# Patient Record
Sex: Female | Born: 1985 | Race: Black or African American | Hispanic: No | Marital: Single | Smoking: Current every day smoker
Health system: Southern US, Community
[De-identification: ages and names within clinical notes are randomized; demographics above are authoritative.]

## PROBLEM LIST (undated history)

## (undated) ENCOUNTER — Inpatient Hospital Stay (HOSPITAL_COMMUNITY): Payer: Self-pay

## (undated) DIAGNOSIS — Z8709 Personal history of other diseases of the respiratory system: Secondary | ICD-10-CM

## (undated) DIAGNOSIS — Z85850 Personal history of malignant neoplasm of thyroid: Secondary | ICD-10-CM

## (undated) DIAGNOSIS — R203 Hyperesthesia: Secondary | ICD-10-CM

## (undated) DIAGNOSIS — E039 Hypothyroidism, unspecified: Secondary | ICD-10-CM

## (undated) DIAGNOSIS — S8262XA Displaced fracture of lateral malleolus of left fibula, initial encounter for closed fracture: Secondary | ICD-10-CM

## (undated) DIAGNOSIS — D649 Anemia, unspecified: Secondary | ICD-10-CM

## (undated) HISTORY — PX: THYROID LOBECTOMY: SHX420

---
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
## 2005-02-13 ENCOUNTER — Emergency Department (HOSPITAL_COMMUNITY): Admission: EM | Admit: 2005-02-13 | Discharge: 2005-02-14 | Payer: Self-pay | Admitting: Emergency Medicine

## 2005-02-20 ENCOUNTER — Emergency Department (HOSPITAL_COMMUNITY): Admission: EM | Admit: 2005-02-20 | Discharge: 2005-02-20 | Payer: Self-pay | Admitting: Emergency Medicine

## 2005-09-10 ENCOUNTER — Other Ambulatory Visit: Admission: RE | Admit: 2005-09-10 | Discharge: 2005-09-10 | Payer: Self-pay | Admitting: Obstetrics and Gynecology

## 2005-12-06 ENCOUNTER — Emergency Department (HOSPITAL_COMMUNITY): Admission: EM | Admit: 2005-12-06 | Discharge: 2005-12-06 | Payer: Self-pay | Admitting: Emergency Medicine

## 2006-03-15 ENCOUNTER — Inpatient Hospital Stay (HOSPITAL_COMMUNITY): Admission: AD | Admit: 2006-03-15 | Discharge: 2006-03-19 | Payer: Self-pay | Admitting: Obstetrics and Gynecology

## 2006-03-16 ENCOUNTER — Encounter (INDEPENDENT_AMBULATORY_CARE_PROVIDER_SITE_OTHER): Payer: Self-pay | Admitting: Specialist

## 2006-05-02 ENCOUNTER — Ambulatory Visit (HOSPITAL_COMMUNITY): Admission: RE | Admit: 2006-05-02 | Discharge: 2006-05-02 | Payer: Self-pay | Admitting: Obstetrics and Gynecology

## 2006-05-09 ENCOUNTER — Encounter: Admission: RE | Admit: 2006-05-09 | Discharge: 2006-05-09 | Payer: Self-pay | Admitting: Obstetrics and Gynecology

## 2006-05-09 ENCOUNTER — Encounter (INDEPENDENT_AMBULATORY_CARE_PROVIDER_SITE_OTHER): Payer: Self-pay | Admitting: *Deleted

## 2006-05-09 ENCOUNTER — Other Ambulatory Visit: Admission: RE | Admit: 2006-05-09 | Discharge: 2006-05-09 | Payer: Self-pay | Admitting: Interventional Radiology

## 2006-06-03 ENCOUNTER — Ambulatory Visit (HOSPITAL_COMMUNITY): Admission: RE | Admit: 2006-06-03 | Discharge: 2006-06-04 | Payer: Self-pay | Admitting: Otolaryngology

## 2006-06-03 ENCOUNTER — Encounter (INDEPENDENT_AMBULATORY_CARE_PROVIDER_SITE_OTHER): Payer: Self-pay | Admitting: *Deleted

## 2006-06-29 ENCOUNTER — Encounter (INDEPENDENT_AMBULATORY_CARE_PROVIDER_SITE_OTHER): Payer: Self-pay | Admitting: Specialist

## 2006-06-29 ENCOUNTER — Ambulatory Visit (HOSPITAL_COMMUNITY): Admission: RE | Admit: 2006-06-29 | Discharge: 2006-06-30 | Payer: Self-pay | Admitting: Otolaryngology

## 2006-06-29 HISTORY — PX: THYROIDECTOMY: SHX17

## 2006-07-29 ENCOUNTER — Encounter (HOSPITAL_COMMUNITY): Admission: RE | Admit: 2006-07-29 | Discharge: 2006-10-27 | Payer: Self-pay | Admitting: Obstetrics and Gynecology

## 2007-06-30 ENCOUNTER — Emergency Department (HOSPITAL_COMMUNITY): Admission: EM | Admit: 2007-06-30 | Discharge: 2007-06-30 | Payer: Self-pay | Admitting: Emergency Medicine

## 2007-11-12 ENCOUNTER — Emergency Department (HOSPITAL_COMMUNITY): Admission: EM | Admit: 2007-11-12 | Discharge: 2007-11-12 | Payer: Self-pay | Admitting: Emergency Medicine

## 2009-05-24 ENCOUNTER — Emergency Department (HOSPITAL_COMMUNITY): Admission: EM | Admit: 2009-05-24 | Discharge: 2009-05-24 | Payer: Self-pay | Admitting: Family Medicine

## 2009-11-26 ENCOUNTER — Ambulatory Visit (HOSPITAL_COMMUNITY): Admission: RE | Admit: 2009-11-26 | Discharge: 2009-11-26 | Payer: Self-pay | Admitting: Obstetrics and Gynecology

## 2010-01-02 ENCOUNTER — Ambulatory Visit: Payer: Self-pay | Admitting: Oncology

## 2010-01-08 LAB — CBC WITH DIFFERENTIAL/PLATELET
BASO%: 0.3 % (ref 0.0–2.0)
Basophils Absolute: 0 10*3/uL (ref 0.0–0.1)
EOS%: 0.7 % (ref 0.0–7.0)
Eosinophils Absolute: 0.1 10*3/uL (ref 0.0–0.5)
HCT: 27.5 % — ABNORMAL LOW (ref 34.8–46.6)
HGB: 8.7 g/dL — ABNORMAL LOW (ref 11.6–15.9)
LYMPH%: 15.7 % (ref 14.0–49.7)
MCH: 24.4 pg — ABNORMAL LOW (ref 25.1–34.0)
MCHC: 31.5 g/dL (ref 31.5–36.0)
MCV: 77.4 fL — ABNORMAL LOW (ref 79.5–101.0)
MONO#: 0.5 10*3/uL (ref 0.1–0.9)
MONO%: 4.8 % (ref 0.0–14.0)
NEUT#: 7.6 10*3/uL — ABNORMAL HIGH (ref 1.5–6.5)
NEUT%: 78.5 % — ABNORMAL HIGH (ref 38.4–76.8)
Platelets: 262 10*3/uL (ref 145–400)
RBC: 3.56 10*6/uL — ABNORMAL LOW (ref 3.70–5.45)
RDW: 27.7 % — ABNORMAL HIGH (ref 11.2–14.5)
WBC: 9.7 10*3/uL (ref 3.9–10.3)
lymph#: 1.5 10*3/uL (ref 0.9–3.3)

## 2010-01-08 LAB — CHCC SMEAR

## 2010-01-08 LAB — MORPHOLOGY: PLT EST: ADEQUATE

## 2010-01-12 ENCOUNTER — Encounter (HOSPITAL_COMMUNITY): Admission: RE | Admit: 2010-01-12 | Discharge: 2010-03-25 | Payer: Self-pay | Admitting: Internal Medicine

## 2010-01-14 LAB — COMPREHENSIVE METABOLIC PANEL
ALT: 12 U/L (ref 0–35)
AST: 16 U/L (ref 0–37)
Albumin: 4 g/dL (ref 3.5–5.2)
Alkaline Phosphatase: 45 U/L (ref 39–117)
BUN: 14 mg/dL (ref 6–23)
CO2: 22 mEq/L (ref 19–32)
Calcium: 8.9 mg/dL (ref 8.4–10.5)
Chloride: 108 mEq/L (ref 96–112)
Creatinine, Ser: 0.9 mg/dL (ref 0.40–1.20)
Glucose, Bld: 85 mg/dL (ref 70–99)
Potassium: 4.3 mEq/L (ref 3.5–5.3)
Sodium: 137 mEq/L (ref 135–145)
Total Bilirubin: 0.3 mg/dL (ref 0.3–1.2)
Total Protein: 6.7 g/dL (ref 6.0–8.3)

## 2010-01-14 LAB — THYROGLOBULIN LEVEL
Thyroglobulin Ab: <30 U/mL (ref 0.0–60.0)
Thyroglobulin: <0.2 ng/mL (ref 0.0–55.0)

## 2010-01-14 LAB — IRON AND TIBC
%SAT: 61 % — ABNORMAL HIGH (ref 20–55)
Iron: 254 ug/dL — ABNORMAL HIGH (ref 42–145)
TIBC: 418 ug/dL (ref 250–470)
UIBC: 164 ug/dL

## 2010-01-14 LAB — T4, FREE: Free T4: 1.22 ng/dL (ref 0.80–1.80)

## 2010-01-14 LAB — FERRITIN: Ferritin: 6 ng/mL — ABNORMAL LOW (ref 10–291)

## 2010-01-14 LAB — TSH: TSH: 17.711 u[IU]/mL — ABNORMAL HIGH (ref 0.350–4.500)

## 2010-01-14 LAB — T4: T4, Total: 7.4 ug/dL (ref 5.0–12.5)

## 2010-01-14 LAB — LACTATE DEHYDROGENASE: LDH: 110 U/L (ref 94–250)

## 2010-03-17 ENCOUNTER — Ambulatory Visit: Payer: Self-pay | Admitting: Advanced Practice Midwife

## 2010-03-17 ENCOUNTER — Inpatient Hospital Stay (HOSPITAL_COMMUNITY)
Admission: AD | Admit: 2010-03-17 | Discharge: 2010-03-17 | Payer: Self-pay | Source: Home / Self Care | Admitting: Obstetrics and Gynecology

## 2010-04-01 ENCOUNTER — Ambulatory Visit: Payer: Self-pay | Admitting: Oncology

## 2010-08-30 ENCOUNTER — Inpatient Hospital Stay (HOSPITAL_COMMUNITY)
Admission: AD | Admit: 2010-08-30 | Discharge: 2010-08-30 | Disposition: A | Discharge status: Other Acute Inpatient Hospital | Payer: Self-pay | Source: Home / Self Care | Attending: Obstetrics and Gynecology | Admitting: Obstetrics and Gynecology

## 2010-09-07 LAB — WET PREP, GENITAL
Clue Cells Wet Prep HPF POC: NONE SEEN
Trich, Wet Prep: NONE SEEN
Yeast Wet Prep HPF POC: NONE SEEN

## 2010-09-07 LAB — GC/CHLAMYDIA PROBE AMP, GENITAL
Chlamydia, DNA Probe: NEGATIVE
GC Probe Amp, Genital: NEGATIVE

## 2010-09-07 LAB — AMNISURE RUPTURE OF MEMBRANE (ROM) NOT AT ARMC: Amnisure ROM: POSITIVE

## 2010-09-13 ENCOUNTER — Encounter: Payer: Self-pay | Admitting: Family Medicine

## 2010-11-07 LAB — URINALYSIS, ROUTINE W REFLEX MICROSCOPIC
Glucose, UA: NEGATIVE mg/dL
Leukocytes, UA: NEGATIVE
Protein, ur: NEGATIVE mg/dL
Specific Gravity, Urine: 1.025 (ref 1.005–1.030)
pH: 6 (ref 5.0–8.0)

## 2010-11-07 LAB — URINE MICROSCOPIC-ADD ON

## 2010-11-07 LAB — POCT PREGNANCY, URINE: Preg Test, Ur: POSITIVE

## 2010-11-09 LAB — HCG, SERUM, QUALITATIVE: Preg, Serum: NEGATIVE

## 2011-01-08 NOTE — Discharge Summary (Signed)
NAME:  Christina Moody, Christina Moody           ACCOUNT NO.:  [id number]   MEDICAL RECORD NO.:  [id number]          PATIENT TYPE:  INP   LOCATION:  9137                          FACILITY:  [facility]   PHYSICIAN:  Zenaida Niece, M.D.DATE OF BIRTH:  [birth year]   DATE OF ADMISSION:  03/15/2006  DATE OF DISCHARGE:  03/19/2006                                 DISCHARGE SUMMARY   ADMISSION DIAGNOSIS:  Intrauterine pregnancy at 39 weeks.   DISCHARGE DIAGNOSES:  Intrauterine pregnancy at 39 weeks, meconium-stained  amniotic fluid, nonreassuring fetal heart tracing.   PROCEDURE:  On July 25 she had a primary low transverse cesarean section.   HISTORY OF PRESENT ILLNESS:  This is a 25 year old black female gravida 1,  para 0 with an EGA of [redacted] weeks who presents with the complaint of  contractions and possibly leaking fluid. Evaluation revealed cervix to be 3,  50, -2 with an intact bag of water and regular contractions. Prenatal care  was essentially uncomplicated.   PRENATAL LABORATORIES:  Blood type is A+ with a negative antibody screen,  RPR nonreactive, rubella immune, hepatitis B surface antigen negative, HIV  negative, gonorrhea and chlamydia negative, 1 hour Glucola was 71, and group  B strep is negative.   PAST GYNECOLOGICAL HISTORY:  History of low grade SIL Pap smear with normal  colposcopy.   MEDICAL HISTORY:  Medical history of asthma for which she rarely takes  Advair and albuterol.   PHYSICAL EXAMINATION:  VITAL SIGNS:  She is afebrile with stable vital  signs.  ABDOMEN:  Gravid, nontender. Fetal heart tracing is reactive with  contractions every 2-4 minutes. Cervix is 3, 50, -2 vertex presentation and  intact bag of water.   HOSPITAL COURSE:  The patient was admitted and continued to contract on her  own. She received an epidural and then was put on Pitocin for augmentation.  On the morning of July25 cervix was 4-5, 80, and -1, and amniotomy revealed  moderate meconium-stained  amniotic fluid. An IUPC was placed to monitor  contractions, and she did receive an amnioinfusion for variable  decelerations. She progressed to complete but had significant decelerations  with pushing, and thus on the afternoon of July25  Dr. Senaida Ores took her to the operating room where she had a primary low  transverse cesarean section done under epidural anesthesia. She delivered a  viable female with Apgars of 3, 6, and 7 that weighed 7 pounds 14 ounces.  Arterial cord pH was 6.95 with a base deficit of 14.5. Anatomy was,  otherwise, normal. Postoperatively she had no significant complications.  Preoperative hemoglobin is 14.2; postoperative is 11.6. On postoperative day  #3 she is stable for discharge home. Her incision is healing well, and prior  to discharge her staples were to be removed and Steri-Strips applied.   DISCHARGE INSTRUCTIONS:  Regular diet, pelvic rest, no strenuous activity.  Follow-up is in 2 weeks. Medications are Percocet  #30, 1-2 p.o. q. 4-6 h. p.r.n. pain and over-the-counter ibuprofen as  needed, and she is given our discharge pamphlet.      Zenaida Niece, M.D.  Electronically Signed  TDM/MEDQ  D:  03/19/2006  T:  03/19/2006  Job:  [id number]

## 2011-01-08 NOTE — Op Note (Signed)
NAME:  Christina Moody, Christina Moody           ACCOUNT NO.:  [id number]   MEDICAL RECORD NO.:  [id number]          PATIENT TYPE:  OIB   LOCATION:  5729                         FACILITY:  [facility]   PHYSICIAN:  Jefry H. Pollyann Kennedy, MD     DATE OF BIRTH:  02/01/1986   DATE OF PROCEDURE:  06/29/2006  DATE OF DISCHARGE:  06/30/2006                                 OPERATIVE REPORT   PREOPERATIVE DIAGNOSIS:  Papillary thyroid carcinoma.   POSTOPERATIVE DIAGNOSIS:  Papillary thyroid carcinoma.   PROCEDURE:  Completion thyroidectomy, right side.   SURGEON:  Jefry H. Pollyann Kennedy, M.D.   ASSISTANTKarren Burly D. Jenne Pane, M.D.   ANESTHESIA:  General endotracheal anesthesia.   COMPLICATIONS:  None.   ESTIMATED BLOOD LOSS:  None.   FINDINGS:  Healthy appearing right thyroid lobe without any surrounding  adenopathy.   HISTORY:  This is a 25 year old female who recently underwent a  hemithyroidectomy which was found to contain, on permanent pathologic  evaluation, a 3.2 cm papillary carcinoma.  The decision was made to perform  completion thyroidectomy so she could undergo postoperative radioiodine scan  and treatment if necessary.  The risks, benefits, alternatives, and  complications of the procedure have been explained to the patient and her  mother, who seemed to understand and agreed to surgery.   DESCRIPTION OF PROCEDURE:  The patient was taken to the operating room and  placed on the operating table in supine position.  Following induction of  general endotracheal anesthesia, the neck was prepped and draped in a  standard fashion.  A marking pen was used to outline the proposed incision  which would include the entire scar and just above and below it.  1%  Xylocaine with epinephrine was infiltrated into the proposed incision lines.  A 15 scalpel was used to incise the skin and to excise the scar.  Electrocautery was then used to dissect down to the subplatysmal layer and  subplatysmal flap was developed  superiorly to the thyroid notch and  inferiorly to the clavicle.  A self-retaining thyroid retractor was used.  The midline fascia was divided and the isthmus was identified.  The thyroid  was then retracted towards the left side as the strap muscles were carefully  dissected off of the lateral aspect of the gland.  During the inferior pole  dissection, a parathyroid was identified and preserved with its blood  supply.  The recurrent nerve was then identified, as well, just superior to  this.  The superior pole vessels were ligated between clamps and divided and  the superior pole was brought down inferiorly.  The ligament of Allyson Sabal was  then divided carefully clamping small vessels along the dissection.  The  lobe was brought off of the trachea and removed and sent for pathologic  evaluation.  Bipolar cautery was used to complete hemostasis.  A 7-French  round JP drain was left in the wound and exited through the left side of the  incision and secured in place with a chromic suture.  The platysma layer and the midline fascia were reapproximated with chromic  suture.  A subcuticular layer  was also closed and Dermabond was used on the  skin.  The patient was then awakened, extubated, and transferred to recovery  in stable condition.      Jefry H. Pollyann Kennedy, MD  Electronically Signed     JHR/MEDQ  D:  07/01/2006  T:  07/01/2006  Job:  [id number]

## 2011-07-25 ENCOUNTER — Emergency Department (HOSPITAL_COMMUNITY): Payer: Medicaid Other

## 2011-07-25 ENCOUNTER — Emergency Department (HOSPITAL_COMMUNITY)
Admission: EM | Admit: 2011-07-25 | Discharge: 2011-07-25 | Disposition: A | Discharge status: Home / Self Care | Payer: Medicaid Other | Attending: Emergency Medicine | Admitting: Emergency Medicine

## 2011-07-25 DIAGNOSIS — R000 Tachycardia, unspecified: Secondary | ICD-10-CM | POA: Insufficient documentation

## 2011-07-25 DIAGNOSIS — R509 Fever, unspecified: Secondary | ICD-10-CM

## 2011-07-25 DIAGNOSIS — J111 Influenza due to unidentified influenza virus with other respiratory manifestations: Secondary | ICD-10-CM | POA: Insufficient documentation

## 2011-07-25 LAB — URINALYSIS, ROUTINE W REFLEX MICROSCOPIC
Bilirubin Urine: NEGATIVE
Glucose, UA: NEGATIVE mg/dL
Hgb urine dipstick: NEGATIVE
Ketones, ur: NEGATIVE mg/dL
Leukocytes, UA: NEGATIVE
Nitrite: NEGATIVE
Protein, ur: NEGATIVE mg/dL
Specific Gravity, Urine: 1.027 (ref 1.005–1.030)
Urobilinogen, UA: 1 mg/dL (ref 0.0–1.0)
pH: 6.5 (ref 5.0–8.0)

## 2011-07-25 LAB — PREGNANCY, URINE: Preg Test, Ur: NEGATIVE

## 2011-07-25 MED ORDER — KETOROLAC TROMETHAMINE 30 MG/ML IJ SOLN
INTRAMUSCULAR | Status: AC
  Administered 2011-07-25: 15 mg
  Filled 2011-07-25: qty 1

## 2011-07-25 MED ORDER — SODIUM CHLORIDE 0.9 % IV BOLUS (SEPSIS)
1000.0000 mL | Freq: Once | INTRAVENOUS | Status: AC
  Administered 2011-07-25: 1000 mL via INTRAVENOUS

## 2011-07-25 MED ORDER — KETOROLAC TROMETHAMINE 15 MG/ML IJ SOLN
15.0000 mg | Freq: Once | INTRAMUSCULAR | Status: DC
  Filled 2011-07-25: qty 1

## 2011-07-25 NOTE — ED Notes (Signed)
Patient transported to X-ray 

## 2011-07-25 NOTE — ED Notes (Signed)
Pt with cough, intermittent x 1 month with occasional post-tussive vomiting. C/o generalized body aches. Decrease in appetite and nausea.  C/o subjective fever, "feel warm", hasn't not taken temperature. Hx thyroid cancer, received radiation treatment every 2 years

## 2011-07-25 NOTE — ED Notes (Signed)
Pt in from home with head cold and body aches states ongoing for 1 month pt presents to the ED with tachycardia 140 states non productive cough and vomiting

## 2011-07-25 NOTE — ED Provider Notes (Signed)
History    25yF with multiple complaints. "I've been sick on and off for the last month." current symptoms started about 2-3 days ago. Subjective fever. Nausea. Cough with occasional post tussive emesis. HA. HA is diffuse without appreciable exacerbating or relieving factors. Gradual onset. No trauma. No neck stiffness. Children with cold like symptoms recently. No rash. No unusual leg pain or swelling. Denies use of birth control. Doesn't think pregnant. No recent surgery or immobilization. Denies hx of blood clot.   CSN: [id number] Arrival date & time: 07/25/2011  9:20 AM   First MD Initiated Contact with Patient 07/25/11 0945      Chief Complaint  Patient presents with  . Generalized Body Aches    (Consider location/radiation/quality/duration/timing/severity/associated sxs/prior treatment) HPI  Past Medical History  Diagnosis Date  . Asthma   . Cancer     History reviewed. No pertinent past surgical history.  No family history on file.  History  Substance Use Topics  . Smoking status: Never Smoker   . Smokeless tobacco: Not on file  . Alcohol Use: No    OB History    Grav Para Term Preterm Abortions TAB SAB Ect Mult Living                  Review of Systems   Review of symptoms negative unless otherwise noted in HPI.   Allergies  Review of patient's allergies indicates no known allergies.  Home Medications   Current Outpatient Rx  Name Route Sig Dispense Refill  . LEVOTHYROXINE SODIUM 175 MCG PO TABS Oral Take 175 mcg by mouth every morning.      Marland Kitchen OVER THE COUNTER MEDICATION Oral Take 30 mLs by mouth every 4 (four) hours as needed. [facility] Cough Syrup. For cough.       BP 96/45  Pulse 108  Temp(Src) 100.3 F (37.9 C) (Oral)  Resp 20  Wt 134 lb 7.7 oz (61 kg)  SpO2 97%  LMP 07/23/2011  Physical Exam  Nursing note and vitals reviewed. Constitutional: She is oriented to person, place, and time. She appears well-developed and well-nourished. No  distress.       Warm to touch  HENT:  Head: Normocephalic and atraumatic.  Right Ear: External ear normal.  Left Ear: External ear normal.  Mouth/Throat: Oropharynx is clear and moist. No oropharyngeal exudate.  Eyes: Conjunctivae are normal. Right eye exhibits no discharge. Left eye exhibits no discharge.  Neck: Normal range of motion. Neck supple.  Cardiovascular: Regular rhythm and normal heart sounds.  Exam reveals no gallop and no friction rub.   No murmur heard.      tachycardic  Pulmonary/Chest: Effort normal and breath sounds normal. No stridor. No respiratory distress.  Abdominal: Soft. She exhibits no distension. There is no tenderness.  Genitourinary:       No cva tenderness  Musculoskeletal: Normal range of motion. She exhibits no edema and no tenderness.  Lymphadenopathy:    She has no cervical adenopathy.  Neurological: She is alert and oriented to person, place, and time. She exhibits normal muscle tone.  Skin: Skin is warm. No rash noted. She is not diaphoretic.  Psychiatric: She has a normal mood and affect. Her behavior is normal. Thought content normal.    ED Course  Procedures (including critical care time)   Labs Reviewed  URINALYSIS, ROUTINE W REFLEX MICROSCOPIC  PREGNANCY, URINE   Dg Chest 2 View  07/25/2011  *RADIOLOGY REPORT*  Clinical Data: Fever, cough.  CHEST - 2  VIEW  Comparison: None.  Findings: Mild peribronchial thickening. Heart and mediastinal contours are within normal limits.  No focal opacities or effusions.  No acute bony abnormality.  IMPRESSION: No active cardiopulmonary disease.  Original Report Authenticated By: Cyndie Chime, M.D.     1. Fever   2. Influenza       MDM  25yf with fever and general malaise. Suspect viral illness, possibly influenza. Low clinical suspicion for SBI. Doubt meningitis. Pt nontoxic and no meningeal signs. CXR clear.  No respiratory distress. Pt feels better with meds and IVF. Plan continued symptomatic  tx and fu as needed. Discussed signs/symptoms to return for immediate re-evaluation.       Raeford Razor, MD 07/25/11 [phone]

## 2012-01-12 ENCOUNTER — Observation Stay (HOSPITAL_COMMUNITY)
Admission: EM | Admit: 2012-01-12 | Discharge: 2012-01-13 | Disposition: A | Discharge status: Home / Self Care | Payer: No Typology Code available for payment source | Attending: Obstetrics and Gynecology | Admitting: Obstetrics and Gynecology

## 2012-01-12 ENCOUNTER — Encounter (HOSPITAL_COMMUNITY): Payer: Self-pay | Admitting: Emergency Medicine

## 2012-01-12 DIAGNOSIS — R109 Unspecified abdominal pain: Secondary | ICD-10-CM | POA: Insufficient documentation

## 2012-01-12 DIAGNOSIS — O99891 Other specified diseases and conditions complicating pregnancy: Secondary | ICD-10-CM | POA: Insufficient documentation

## 2012-01-12 DIAGNOSIS — O479 False labor, unspecified: Secondary | ICD-10-CM

## 2012-01-12 DIAGNOSIS — Z3480 Encounter for supervision of other normal pregnancy, unspecified trimester: Secondary | ICD-10-CM

## 2012-01-12 DIAGNOSIS — O4700 False labor before 37 completed weeks of gestation, unspecified trimester: Principal | ICD-10-CM | POA: Insufficient documentation

## 2012-01-12 HISTORY — DX: Hypothyroidism, unspecified: E03.9
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
## 2012-01-12 NOTE — ED Notes (Signed)
Unable to find patient in triage
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
## 2012-01-13 ENCOUNTER — Observation Stay (HOSPITAL_COMMUNITY): Payer: No Typology Code available for payment source

## 2012-01-13 ENCOUNTER — Encounter (HOSPITAL_COMMUNITY): Payer: Self-pay

## 2012-01-13 MED ORDER — DOCUSATE SODIUM 100 MG PO CAPS: 100.0000 mg | ORAL_CAPSULE | Freq: Every day | ORAL | Status: DC

## 2012-01-13 MED ORDER — CALCIUM CARBONATE ANTACID 500 MG PO CHEW: 2.0000 | CHEWABLE_TABLET | ORAL | Status: DC | PRN

## 2012-01-13 MED ORDER — LACTATED RINGERS IV SOLN
INTRAVENOUS | Status: DC
  Administered 2012-01-13: 01:00:00 via INTRAVENOUS

## 2012-01-13 MED ORDER — ACETAMINOPHEN 325 MG PO TABS
650.0000 mg | ORAL_TABLET | ORAL | Status: DC | PRN
  Administered 2012-01-13 (×2): 650 mg via ORAL
  Filled 2012-01-13 (×2): qty 2

## 2012-01-13 MED ORDER — ZOLPIDEM TARTRATE 10 MG PO TABS
10.0000 mg | ORAL_TABLET | Freq: Every evening | ORAL | Status: DC | PRN
  Administered 2012-01-13: 10 mg via ORAL
  Filled 2012-01-13: qty 1

## 2012-01-13 MED ORDER — PRENATAL MULTIVITAMIN CH: 1.0000 | ORAL_TABLET | Freq: Every day | ORAL | Status: DC
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
## 2012-03-15 ENCOUNTER — Encounter (HOSPITAL_COMMUNITY): Payer: Self-pay | Admitting: Anesthesiology

## 2012-03-15 ENCOUNTER — Encounter (HOSPITAL_COMMUNITY): Payer: Self-pay | Admitting: Obstetrics and Gynecology

## 2012-03-15 ENCOUNTER — Encounter (HOSPITAL_COMMUNITY)
Admission: AD | Disposition: A | Discharge status: Home / Self Care | Payer: Self-pay | Source: Ambulatory Visit | Attending: Obstetrics and Gynecology

## 2012-03-15 ENCOUNTER — Inpatient Hospital Stay (HOSPITAL_COMMUNITY): Payer: Medicaid Other

## 2012-03-15 ENCOUNTER — Encounter (HOSPITAL_COMMUNITY): Payer: Self-pay | Admitting: *Deleted

## 2012-03-15 ENCOUNTER — Inpatient Hospital Stay (HOSPITAL_COMMUNITY): Payer: Medicaid Other | Admitting: Anesthesiology

## 2012-03-15 ENCOUNTER — Inpatient Hospital Stay (HOSPITAL_COMMUNITY)
Admission: AD | Admit: 2012-03-15 | Discharge: 2012-03-17 | DRG: 766 | Disposition: A | Discharge status: Home / Self Care | Payer: Medicaid Other | Source: Ambulatory Visit | Attending: Obstetrics and Gynecology | Admitting: Obstetrics and Gynecology

## 2012-03-15 DIAGNOSIS — O34219 Maternal care for unspecified type scar from previous cesarean delivery: Secondary | ICD-10-CM | POA: Diagnosis present

## 2012-03-15 DIAGNOSIS — O364XX0 Maternal care for intrauterine death, not applicable or unspecified: Principal | ICD-10-CM | POA: Diagnosis present

## 2012-03-15 LAB — CBC
HCT: 25.4 % — ABNORMAL LOW (ref 36.0–46.0)
Hemoglobin: 8.7 g/dL — ABNORMAL LOW (ref 12.0–15.0)
MCH: 31.4 pg (ref 26.0–34.0)
MCHC: 34.4 g/dL (ref 30.0–36.0)
MCHC: 34.3 g/dL (ref 30.0–36.0)
MCV: 91.4 fL (ref 78.0–100.0)
MCV: 92.4 fL (ref 78.0–100.0)
Platelets: 189 10*3/uL (ref 150–400)
RDW: 13.3 % (ref 11.5–15.5)
WBC: 13.9 10*3/uL — ABNORMAL HIGH (ref 4.0–10.5)

## 2012-03-15 LAB — COMPREHENSIVE METABOLIC PANEL
AST: 16 U/L (ref 0–37)
Alkaline Phosphatase: 69 U/L (ref 39–117)
BUN: 7 mg/dL (ref 6–23)
CO2: 21 mEq/L (ref 19–32)
Calcium: 8.7 mg/dL (ref 8.4–10.5)
Chloride: 105 mEq/L (ref 96–112)
Creatinine, Ser: 0.51 mg/dL (ref 0.50–1.10)
Creatinine, Ser: 0.66 mg/dL (ref 0.50–1.10)
GFR calc Af Amer: >90 mL/min (ref >90)
GFR calc non Af Amer: >90 mL/min (ref >90)
Glucose, Bld: 72 mg/dL (ref 70–99)
Potassium: 3.5 mEq/L (ref 3.5–5.1)
Total Bilirubin: 0.3 mg/dL (ref 0.3–1.2)
Total Protein: 6.1 g/dL (ref 6.0–8.3)

## 2012-03-15 LAB — SURGICAL PCR SCREEN
MRSA, PCR: NEGATIVE
Staphylococcus aureus: NEGATIVE

## 2012-03-15 LAB — TYPE AND SCREEN
ABO/RH(D): A POS
Unit division: 0
Unit division: 0

## 2012-03-15 LAB — RPR: RPR Ser Ql: NONREACTIVE

## 2012-03-15 SURGERY — Surgical Case
Anesthesia: Spinal | Site: Abdomen | Wound class: Clean Contaminated

## 2012-03-15 MED ORDER — DIPHENHYDRAMINE HCL 50 MG/ML IJ SOLN: 25.0000 mg | INTRAMUSCULAR | Status: DC | PRN

## 2012-03-15 MED ORDER — PRENATAL MULTIVITAMIN CH
1.0000 | ORAL_TABLET | Freq: Every day | ORAL | Status: DC
  Administered 2012-03-16: 1 via ORAL
  Filled 2012-03-15: qty 1

## 2012-03-15 MED ORDER — DIPHENHYDRAMINE HCL 50 MG/ML IJ SOLN: 12.5000 mg | INTRAMUSCULAR | Status: DC | PRN

## 2012-03-15 MED ORDER — ZOLPIDEM TARTRATE 5 MG PO TABS
5.0000 mg | ORAL_TABLET | Freq: Every evening | ORAL | Status: DC | PRN
  Administered 2012-03-16: 5 mg via ORAL
  Filled 2012-03-15: qty 1

## 2012-03-15 MED ORDER — LACTATED RINGERS IV SOLN: INTRAVENOUS | Status: DC | PRN

## 2012-03-15 MED ORDER — LEVOTHYROXINE SODIUM 150 MCG PO TABS
150.0000 ug | ORAL_TABLET | Freq: Every day | ORAL | Status: DC
  Filled 2012-03-15: qty 1

## 2012-03-15 MED ORDER — SIMETHICONE 80 MG PO CHEW: 80.0000 mg | CHEWABLE_TABLET | ORAL | Status: DC | PRN

## 2012-03-15 MED ORDER — OXYTOCIN 40 UNITS IN LACTATED RINGERS INFUSION - SIMPLE MED: 62.5000 mL/h | INTRAVENOUS | Status: AC

## 2012-03-15 MED ORDER — WITCH HAZEL-GLYCERIN EX PADS: 1.0000 "application " | MEDICATED_PAD | CUTANEOUS | Status: DC | PRN

## 2012-03-15 MED ORDER — OXYTOCIN 10 UNIT/ML IJ SOLN
INTRAMUSCULAR | Status: AC
  Filled 2012-03-15: qty 4

## 2012-03-15 MED ORDER — TETANUS-DIPHTH-ACELL PERTUSSIS 5-2.5-18.5 LF-MCG/0.5 IM SUSP: 0.5000 mL | Freq: Once | INTRAMUSCULAR | Status: DC

## 2012-03-15 MED ORDER — OXYCODONE-ACETAMINOPHEN 5-325 MG PO TABS
1.0000 | ORAL_TABLET | ORAL | Status: DC | PRN
  Administered 2012-03-16 – 2012-03-17 (×4): 1 via ORAL
  Filled 2012-03-15 (×4): qty 1

## 2012-03-15 MED ORDER — MORPHINE SULFATE 0.5 MG/ML IJ SOLN
INTRAMUSCULAR | Status: AC
  Filled 2012-03-15: qty 10

## 2012-03-15 MED ORDER — FENTANYL CITRATE 0.05 MG/ML IJ SOLN
INTRAMUSCULAR | Status: AC
  Filled 2012-03-15: qty 2

## 2012-03-15 MED ORDER — SODIUM CHLORIDE 0.9 % IJ SOLN: 3.0000 mL | INTRAMUSCULAR | Status: DC | PRN

## 2012-03-15 MED ORDER — ONDANSETRON HCL 4 MG/2ML IJ SOLN: 4.0000 mg | INTRAMUSCULAR | Status: DC | PRN

## 2012-03-15 MED ORDER — NALBUPHINE SYRINGE 5 MG/0.5 ML
INJECTION | INTRAMUSCULAR | Status: AC
Start: 2012-03-15 — End: 2012-03-15
  Administered 2012-03-15: 10 mg
  Filled 2012-03-15: qty 1

## 2012-03-15 MED ORDER — SIMETHICONE 80 MG PO CHEW
80.0000 mg | CHEWABLE_TABLET | Freq: Three times a day (TID) | ORAL | Status: DC
  Administered 2012-03-15 – 2012-03-16 (×2): 80 mg via ORAL

## 2012-03-15 MED ORDER — IBUPROFEN 600 MG PO TABS
600.0000 mg | ORAL_TABLET | Freq: Four times a day (QID) | ORAL | Status: DC
  Administered 2012-03-16 – 2012-03-17 (×4): 600 mg via ORAL
  Filled 2012-03-15 (×3): qty 1

## 2012-03-15 MED ORDER — CEFAZOLIN SODIUM-DEXTROSE 2-3 GM-% IV SOLR
2.0000 g | INTRAVENOUS | Status: AC
  Administered 2012-03-15: 2 g via INTRAVENOUS
  Filled 2012-03-15: qty 50

## 2012-03-15 MED ORDER — NALOXONE HCL 0.4 MG/ML IJ SOLN: 0.4000 mg | INTRAMUSCULAR | Status: DC | PRN

## 2012-03-15 MED ORDER — CITRIC ACID-SODIUM CITRATE 334-500 MG/5ML PO SOLN
30.0000 mL | Freq: Once | ORAL | Status: DC
  Filled 2012-03-15: qty 15

## 2012-03-15 MED ORDER — FENTANYL CITRATE 0.05 MG/ML IJ SOLN
INTRAMUSCULAR | Status: DC | PRN
  Administered 2012-03-15: 15 ug via INTRATHECAL

## 2012-03-15 MED ORDER — SCOPOLAMINE 1 MG/3DAYS TD PT72
1.0000 | MEDICATED_PATCH | Freq: Once | TRANSDERMAL | Status: DC
  Administered 2012-03-15: 1.5 mg via TRANSDERMAL

## 2012-03-15 MED ORDER — LACTATED RINGERS IV SOLN
INTRAVENOUS | Status: DC | PRN
  Administered 2012-03-15: 13:00:00 via INTRAVENOUS

## 2012-03-15 MED ORDER — DIPHENHYDRAMINE HCL 12.5 MG/5ML PO ELIX
12.5000 mg | ORAL_SOLUTION | Freq: Four times a day (QID) | ORAL | Status: DC | PRN
  Administered 2012-03-15: 12.5 mg via ORAL
  Filled 2012-03-15: qty 5

## 2012-03-15 MED ORDER — KETOROLAC TROMETHAMINE 30 MG/ML IJ SOLN: 30.0000 mg | Freq: Four times a day (QID) | INTRAMUSCULAR | Status: AC | PRN

## 2012-03-15 MED ORDER — DIPHENHYDRAMINE HCL 25 MG PO CAPS
25.0000 mg | ORAL_CAPSULE | ORAL | Status: DC | PRN
Start: 2012-03-15 — End: 2012-03-17

## 2012-03-15 MED ORDER — SODIUM CHLORIDE 0.9 % IV SOLN: 1.0000 ug/kg/h | INTRAVENOUS | Status: DC | PRN

## 2012-03-15 MED ORDER — BUPIVACAINE IN DEXTROSE 0.75-8.25 % IT SOLN
INTRATHECAL | Status: DC | PRN
  Administered 2012-03-15: 1.5 mL via INTRATHECAL

## 2012-03-15 MED ORDER — DIPHENHYDRAMINE HCL 25 MG PO CAPS: 25.0000 mg | ORAL_CAPSULE | Freq: Four times a day (QID) | ORAL | Status: DC | PRN

## 2012-03-15 MED ORDER — FENTANYL CITRATE 0.05 MG/ML IJ SOLN
INTRAMUSCULAR | Status: AC
  Administered 2012-03-15: 50 ug via INTRAVENOUS
  Filled 2012-03-15: qty 2

## 2012-03-15 MED ORDER — ONDANSETRON HCL 4 MG/2ML IJ SOLN
INTRAMUSCULAR | Status: AC
  Filled 2012-03-15: qty 2

## 2012-03-15 MED ORDER — LACTATED RINGERS IV BOLUS (SEPSIS)
500.0000 mL | Freq: Once | INTRAVENOUS | Status: AC
  Administered 2012-03-15: 500 mL via INTRAVENOUS

## 2012-03-15 MED ORDER — LEVOTHYROXINE SODIUM 175 MCG PO TABS
175.0000 ug | ORAL_TABLET | Freq: Every day | ORAL | Status: DC
  Administered 2012-03-15: 175 ug via ORAL
  Filled 2012-03-15 (×2): qty 1

## 2012-03-15 MED ORDER — SODIUM CHLORIDE 0.9 % IJ SOLN: 9.0000 mL | INTRAMUSCULAR | Status: DC | PRN

## 2012-03-15 MED ORDER — KETOROLAC TROMETHAMINE 60 MG/2ML IM SOLN
60.0000 mg | Freq: Once | INTRAMUSCULAR | Status: AC | PRN
  Administered 2012-03-15: 60 mg via INTRAMUSCULAR
  Filled 2012-03-15: qty 2

## 2012-03-15 MED ORDER — METOCLOPRAMIDE HCL 5 MG/ML IJ SOLN: 10.0000 mg | Freq: Three times a day (TID) | INTRAMUSCULAR | Status: DC | PRN

## 2012-03-15 MED ORDER — DIPHENHYDRAMINE HCL 50 MG/ML IJ SOLN: 12.5000 mg | Freq: Four times a day (QID) | INTRAMUSCULAR | Status: DC | PRN

## 2012-03-15 MED ORDER — ONDANSETRON HCL 4 MG/2ML IJ SOLN: 4.0000 mg | Freq: Four times a day (QID) | INTRAMUSCULAR | Status: DC | PRN

## 2012-03-15 MED ORDER — ZOLPIDEM TARTRATE 5 MG PO TABS: 5.0000 mg | ORAL_TABLET | Freq: Every evening | ORAL | Status: DC | PRN

## 2012-03-15 MED ORDER — ONDANSETRON HCL 4 MG PO TABS: 4.0000 mg | ORAL_TABLET | ORAL | Status: DC | PRN

## 2012-03-15 MED ORDER — DIBUCAINE 1 % RE OINT: 1.0000 "application " | TOPICAL_OINTMENT | RECTAL | Status: DC | PRN

## 2012-03-15 MED ORDER — LACTATED RINGERS IV SOLN
INTRAVENOUS | Status: DC | PRN
  Administered 2012-03-15 (×2): via INTRAVENOUS

## 2012-03-15 MED ORDER — MEPERIDINE HCL 25 MG/ML IJ SOLN: 6.2500 mg | INTRAMUSCULAR | Status: DC | PRN

## 2012-03-15 MED ORDER — ONDANSETRON HCL 4 MG/2ML IJ SOLN: 4.0000 mg | Freq: Three times a day (TID) | INTRAMUSCULAR | Status: DC | PRN

## 2012-03-15 MED ORDER — MEASLES, MUMPS & RUBELLA VAC ~~LOC~~ INJ
0.5000 mL | INJECTION | Freq: Once | SUBCUTANEOUS | Status: DC
  Filled 2012-03-15: qty 0.5

## 2012-03-15 MED ORDER — LANOLIN HYDROUS EX OINT: 1.0000 "application " | TOPICAL_OINTMENT | CUTANEOUS | Status: DC | PRN

## 2012-03-15 MED ORDER — SCOPOLAMINE 1 MG/3DAYS TD PT72
MEDICATED_PATCH | TRANSDERMAL | Status: AC
  Administered 2012-03-15: 1.5 mg via TRANSDERMAL
  Filled 2012-03-15: qty 1

## 2012-03-15 MED ORDER — SENNOSIDES-DOCUSATE SODIUM 8.6-50 MG PO TABS
2.0000 | ORAL_TABLET | Freq: Every day | ORAL | Status: DC
  Administered 2012-03-15 – 2012-03-16 (×2): 2 via ORAL

## 2012-03-15 MED ORDER — PHENYLEPHRINE HCL 10 MG/ML IJ SOLN
INTRAMUSCULAR | Status: DC | PRN
  Administered 2012-03-15 (×2): 80 ug via INTRAVENOUS
  Administered 2012-03-15: 40 ug via INTRAVENOUS

## 2012-03-15 MED ORDER — FAMOTIDINE IN NACL 20-0.9 MG/50ML-% IV SOLN
20.0000 mg | Freq: Once | INTRAVENOUS | Status: AC
  Administered 2012-03-15: 20 mg via INTRAVENOUS
  Filled 2012-03-15: qty 50

## 2012-03-15 MED ORDER — FUROSEMIDE 10 MG/ML IJ SOLN
20.0000 mg | Freq: Once | INTRAMUSCULAR | Status: AC
  Administered 2012-03-15: 20 mg via INTRAVENOUS
  Filled 2012-03-15: qty 2

## 2012-03-15 MED ORDER — LACTATED RINGERS IV BOLUS (SEPSIS)
500.0000 mL | Freq: Once | INTRAVENOUS | Status: AC
  Administered 2012-03-15: 1000 mL via INTRAVENOUS

## 2012-03-15 MED ORDER — MAGNESIUM HYDROXIDE 400 MG/5ML PO SUSP: 30.0000 mL | ORAL | Status: DC | PRN

## 2012-03-15 MED ORDER — LEVOTHYROXINE SODIUM 175 MCG PO TABS
175.0000 ug | ORAL_TABLET | Freq: Every morning | ORAL | Status: DC
  Administered 2012-03-16 – 2012-03-17 (×2): 175 ug via ORAL
  Filled 2012-03-15 (×3): qty 1

## 2012-03-15 MED ORDER — ACETAMINOPHEN 325 MG PO TABS: 650.0000 mg | ORAL_TABLET | ORAL | Status: DC | PRN

## 2012-03-15 MED ORDER — IBUPROFEN 600 MG PO TABS
600.0000 mg | ORAL_TABLET | Freq: Four times a day (QID) | ORAL | Status: DC | PRN
  Filled 2012-03-15: qty 1

## 2012-03-15 MED ORDER — KETOROLAC TROMETHAMINE 60 MG/2ML IM SOLN
INTRAMUSCULAR | Status: AC
  Administered 2012-03-15: 60 mg via INTRAMUSCULAR
  Filled 2012-03-15: qty 2

## 2012-03-15 MED ORDER — HYDROMORPHONE 0.3 MG/ML IV SOLN
INTRAVENOUS | Status: DC
Start: 2012-03-15 — End: 2012-03-16
  Administered 2012-03-15: 0.2 mg via INTRAVENOUS
  Administered 2012-03-15: 17:00:00 via INTRAVENOUS
  Administered 2012-03-16: 0.2 mg via INTRAVENOUS
  Administered 2012-03-16: 0.199 mg via INTRAVENOUS
  Filled 2012-03-15: qty 25

## 2012-03-15 MED ORDER — MORPHINE SULFATE (PF) 0.5 MG/ML IJ SOLN
INTRAMUSCULAR | Status: DC | PRN
  Administered 2012-03-15: .1 mg via INTRATHECAL

## 2012-03-15 MED ORDER — MIDAZOLAM HCL 2 MG/2ML IJ SOLN
INTRAMUSCULAR | Status: AC
  Filled 2012-03-15: qty 2

## 2012-03-15 MED ORDER — MENTHOL 3 MG MT LOZG: 1.0000 | LOZENGE | OROMUCOSAL | Status: DC | PRN

## 2012-03-15 MED ORDER — NALBUPHINE HCL 10 MG/ML IJ SOLN
5.0000 mg | INTRAMUSCULAR | Status: DC | PRN
  Administered 2012-03-15: 5 mg via INTRAVENOUS
  Filled 2012-03-15: qty 1

## 2012-03-15 MED ORDER — OXYTOCIN 10 UNIT/ML IJ SOLN
40.0000 [IU] | INTRAVENOUS | Status: DC | PRN
  Administered 2012-03-15: 40 [IU] via INTRAVENOUS

## 2012-03-15 MED ORDER — FENTANYL CITRATE 0.05 MG/ML IJ SOLN
25.0000 ug | INTRAMUSCULAR | Status: DC | PRN
  Administered 2012-03-15 (×2): 50 ug via INTRAVENOUS

## 2012-03-15 MED ORDER — NALBUPHINE HCL 10 MG/ML IJ SOLN
5.0000 mg | INTRAMUSCULAR | Status: DC | PRN
  Filled 2012-03-15: qty 0.5
  Filled 2012-03-15: qty 1

## 2012-03-15 MED ORDER — ONDANSETRON HCL 4 MG/2ML IJ SOLN
INTRAMUSCULAR | Status: DC | PRN
  Administered 2012-03-15: 4 mg via INTRAVENOUS

## 2012-03-15 SURGICAL SUPPLY — 29 items
BENZOIN TINCTURE PRP APPL 2/3 (GAUZE/BANDAGES/DRESSINGS) ×2 IMPLANT
CHLORAPREP W/TINT 26ML (MISCELLANEOUS) ×2 IMPLANT
CLOTH BEACON ORANGE TIMEOUT ST (SAFETY) ×2 IMPLANT
CONTAINER PREFILL 10% NBF 15ML (MISCELLANEOUS) IMPLANT
DRSG COVADERM 4X10 (GAUZE/BANDAGES/DRESSINGS) ×2 IMPLANT
ELECT REM PT RETURN 9FT ADLT (ELECTROSURGICAL) ×2
ELECTRODE REM PT RTRN 9FT ADLT (ELECTROSURGICAL) ×1 IMPLANT
EXTRACTOR VACUUM KIWI (MISCELLANEOUS) IMPLANT
EXTRACTOR VACUUM M CUP 4 TUBE (SUCTIONS) IMPLANT
GLOVE BIO SURGEON STRL SZ8 (GLOVE) ×2 IMPLANT
GLOVE ORTHO TXT STRL SZ7.5 (GLOVE) ×2 IMPLANT
GOWN PREVENTION PLUS LG XLONG (DISPOSABLE) ×4 IMPLANT
KIT ABG SYR 3ML LUER SLIP (SYRINGE) IMPLANT
NEEDLE HYPO 25X5/8 SAFETYGLIDE (NEEDLE) ×2 IMPLANT
NS IRRIG 1000ML POUR BTL (IV SOLUTION) ×2 IMPLANT
PACK C SECTION WH (CUSTOM PROCEDURE TRAY) ×2 IMPLANT
RTRCTR C-SECT PINK 25CM LRG (MISCELLANEOUS) ×2 IMPLANT
SLEEVE SCD COMPRESS KNEE MED (MISCELLANEOUS) IMPLANT
STAPLER VISISTAT 35W (STAPLE) IMPLANT
STRIP CLOSURE SKIN 1/2X4 (GAUZE/BANDAGES/DRESSINGS) ×2 IMPLANT
SUT CHROMIC 1 CTX 36 (SUTURE) ×6 IMPLANT
SUT PLAIN 0 NONE (SUTURE) ×2 IMPLANT
SUT PLAIN 2 0 XLH (SUTURE) IMPLANT
SUT VIC AB 0 CT1 27 (SUTURE) ×2
SUT VIC AB 0 CT1 27XBRD ANBCTR (SUTURE) ×2 IMPLANT
SUT VIC AB 4-0 KS 27 (SUTURE) IMPLANT
TOWEL OR 17X24 6PK STRL BLUE (TOWEL DISPOSABLE) ×4 IMPLANT
TRAY FOLEY CATH 14FR (SET/KITS/TRAYS/PACK) ×2 IMPLANT
WATER STERILE IRR 1000ML POUR (IV SOLUTION) ×2 IMPLANT

## 2012-03-15 NOTE — Op Note (Addendum)
Preoperative diagnosis: Intrauterine pregnancy at 31 weeks, fetal demise, previous cesarean section x 2 Postoperative diagnosis: Same Procedure: Repeat low transverse cesarean section Surgeon: Lavina Hamman M.D. Assistant:  Huel Cote, MD Anesthesia: Spinal Findings: Patient had normal gravid anatomy and delivered a nonviable female infant, weight pending Estimated blood loss: 500 cc Specimens: Placenta sent to pathology Complications: None  Procedure in detail: The patient was taken to the operating room and placed in the sitting position. Dr. Rodman Pickle instilled spinal anesthesia. Abdomen was then prepped and draped in the usual sterile fashion. A Foley catheter was placed. The level of her anesthesia was found to be adequate. Abdomen was entered via a standard Pfannenstiel incision through her previous scar. Once the peritoneal cavity was entered the Alexis disposable self-retaining retractor was placed and good visualization was achieved. A 4 cm transverse incision was then made in the lower uterine segment pushing the bladder inferior. Once the uterine cavity was entered the incision was extended digitally. 20 cc of amniotic fluid was then collected for chromosomes and PCR prior to ruptured membranes.  Membranes then ruptured revealing greenish fluid.  The fetal vertex was grasped and delivered through the incision atraumatically. Fairly loose nuchal cord x 2 reduced.  The remainder of the infant then delivered atraumatically. Cord was doubly clamped and cut and the infant handed to the OR team. The placenta delivered manually. Uterus was wiped dry with clean lap pad and all clots and debris were removed. Uterine incision was inspected and found to be free of extensions. Uterine incision was closed in one layer with running locking #1 chromic.  2 figure of 8 sutures of #1 Chromic used on the left angle for hemostasis. Tubes and ovaries were inspected and found to be normal. Uterine incision was  inspected and found to be hemostatic. Bleeding from serosal edges was controlled with electrocautery. The Alexis retractor was removed. Subfascial space was irrigated and made hemostatic with electrocautery. Fascia was closed in running fashion starting at both ends and meeting in the middle with 0 Vicryl. Subcutaneous tissue was then irrigated and made hemostatic with electrocautery. Skin was closed with running 4-0 Vicryl in a subcuticular stitch followed by steri-strips and a sterile dressing. Patient tolerated the procedure well and was taken to the recovery in stable condition. Counts were correct x2, she received Ancef 2 g IV at the beginning of the procedure and she had PAS hose on throughout the procedure.

## 2012-03-15 NOTE — Progress Notes (Signed)
Patient ID: Christina Moody, female   DOB: 06-29-1986, 26 y.o.   MRN: [id number] Pt was admitted for fetal demise. She has had 2 prior c sections and the second was for abruption at 31 weeks and required a "T' OF THE INCISION. Pt reports no fetal movement since 03-12-12 [place] here confirmed fetal demise. I have spoken to Dr. Jackelyn Knife and Senaida Ores about the pt and they will assume her care. The pt and her mother prefer that I not participate in her care because of perceived insensitivity in my statements over the phone.

## 2012-03-15 NOTE — MAU Note (Signed)
Pt to triage from lobby , pt complaint of no fetal movement x 3 days. Unable to find FHT'S with doppler, Shaw,CNM in MAU and requested to come to triage to locate [facility] with u/s. Same attempted, Shaw,CNM sees ? FHT'S but not sure, pt taken to room 5 and u/s called to come for stat BS scan. Katrinka Blazing , CNM at bedside and RN continues to locate FHT's but none heard.

## 2012-03-15 NOTE — Transfer of Care (Signed)
Immediate Anesthesia Transfer of Care Note  Patient: Christina Moody  Procedure(s) Performed: Procedure(s) (LRB): CESAREAN SECTION (N/A)  Patient Location: PACU  Anesthesia Type: Spinal  Level of Consciousness: awake, alert  and oriented  Airway & Oxygen Therapy: Patient Spontanous Breathing and Patient connected to face mask oxygen  Post-op Assessment: Report given to PACU RN and Post -op Vital signs reviewed and stable  Post vital signs: Reviewed and stable  Complications: No apparent anesthesia complications

## 2012-03-15 NOTE — Anesthesia Procedure Notes (Signed)
Spinal  Patient location during procedure: OR Start time: 03/15/2012 12:48 PM Staffing Performed by: anesthesiologist  Preanesthetic Checklist Completed: patient identified, site marked, surgical consent, pre-op evaluation, timeout performed, IV checked, risks and benefits discussed and monitors and equipment checked Spinal Block Patient position: sitting Prep: site prepped and draped and DuraPrep Patient monitoring: heart rate, cardiac monitor, continuous pulse ox and blood pressure Approach: midline Location: L3-4 Injection technique: single-shot Needle Needle type: Sprotte  Needle gauge: 24 G Needle length: 9 cm Assessment Sensory level: T4 Additional Notes Clear free flow CSF on first attempt.  No paresthesia.  Patient tolerated procedure well.  Jasmine December, MD

## 2012-03-15 NOTE — Progress Notes (Signed)
   History reviewed, patient examined, no change in status, stable for surgery.  

## 2012-03-15 NOTE — MAU Note (Signed)
Pt and mother decline Dr. Ebony Hail presence and / or intervention, stating that when pt called, Dr. Ambrose Mantle was rude and uncaring.

## 2012-03-15 NOTE — Progress Notes (Signed)
03/15/12 1500  Clinical Encounter Type  Visited With Health care provider (RN/NT)  Visit Type Other (Comment) (prep for Comfort bereavement support)  Referral From Nurse  Recommendations Spiritual Care will follow for support.  Spiritual Encounters  Spiritual Needs Grief support;Other (Comment) (Comfort materials)    Have been following Christina Moody from a distance today.  Helped RN Selena Batten in [employer] (packet, memory box, hat, diapers, gown, etc), as AICU usually cares for families with smaller babies/earlier GA.  Christina Moody is currently sleeping.    Please call [phone] for on-call chaplain to provide bereavement support tonight as needed.  [employer] or I will follow up tomorrow.  Thank you!  [address] [place], [place] [phone]
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
## 2012-03-15 NOTE — Addendum Note (Signed)
Addendum  created 03/15/12 1710 by Dana Allan, MD   Modules edited:Orders, PRL Based Order Sets

## 2012-03-15 NOTE — Anesthesia Preprocedure Evaluation (Addendum)
Anesthesia Evaluation  Patient identified by MRN, date of birth, ID band Patient awake    Reviewed: Allergy & Precautions, H&P , NPO status , Patient's Chart, lab work & pertinent test results, reviewed documented beta blocker date and time   History of Anesthesia Complications Negative for: history of anesthetic complications  Airway Mallampati: I TM Distance: >3 FB Neck ROM: full    Dental  (+) Teeth Intact   Pulmonary asthma (h/o hospitalization at 26 yo) , Current Smoker,  breath sounds clear to auscultation        Cardiovascular negative cardio ROS  Rhythm:regular Rate:Normal     Neuro/Psych negative neurological ROS  negative psych ROS   GI/Hepatic negative GI ROS, Neg liver ROS,   Endo/Other  Hypothyroidism (s/p thyroidectomy for thyroid CA)   Renal/GU negative Renal ROS     Musculoskeletal   Abdominal   Peds  Hematology  (+) Blood dyscrasia (hgb 9.9), anemia ,   Anesthesia Other Findings   Reproductive/Obstetrics (+) Pregnancy (h/o c/s x2, now with IUFD at 34 weeks)                          Anesthesia Physical Anesthesia Plan  ASA: II  Anesthesia Plan: Spinal   Post-op Pain Management:    Induction:   Airway Management Planned:   Additional Equipment:   Intra-op Plan:   Post-operative Plan:   Informed Consent: I have reviewed the patients History and Physical, chart, labs and discussed the procedure including the risks, benefits and alternatives for the proposed anesthesia with the patient or authorized representative who has indicated his/her understanding and acceptance.     Plan Discussed with: Surgeon and CRNA  Anesthesia Plan Comments:         Anesthesia Quick Evaluation

## 2012-03-15 NOTE — Progress Notes (Signed)
Patient ID: Christina Moody, female   DOB: 20-Jan-1986, 26 y.o.   MRN: [id number] Pt feeling itchy from IV pain meds.  UOP diminished but concentrated and pt did not eat after 900pm last night. She does not want to hold the baby, is fearful of dead people.  Emotional support given.  Will give 500cc bolus and see if boosts UOP.
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
## 2012-03-15 NOTE — Progress Notes (Signed)
I have spoken with the patient about her delivery options with IUFD and 2 previous c-sections, last one with "T" extension.  I discussed attempting vaginal delivery with foley bulb to dilate cervix, avoiding Cytotec.  She and her mom are considering her options and will let me know.

## 2012-03-15 NOTE — MAU Note (Signed)
Pt c/o absent fetal movement since Saturday 48 hrs ago,

## 2012-03-15 NOTE — Anesthesia Postprocedure Evaluation (Signed)
Anesthesia Post Note  Patient: Christina Moody  Procedure(s) Performed: Procedure(s) (LRB): CESAREAN SECTION (N/A)  Anesthesia type: Spinal  Patient location: PACU  Post pain: Pain level controlled  Post assessment: Post-op Vital signs reviewed  Last Vitals:  Filed Vitals:   03/15/12 1400  BP: 102/64  Pulse: 57  Temp:   Resp: 16    Post vital signs: Reviewed  Level of consciousness: awake  Complications: No apparent anesthesia complications

## 2012-03-15 NOTE — MAU Note (Signed)
Ivonne Andrew CNM in room explaining plan of care with pt. Pt opts to be admitted and discuss plan of care with oncoming physician. Pt quiet and tearful; declines chaplain visit at this time.

## 2012-03-15 NOTE — Progress Notes (Signed)
1530 - refused autopsy for infant

## 2012-03-16 ENCOUNTER — Encounter (HOSPITAL_COMMUNITY): Payer: Self-pay | Admitting: Obstetrics and Gynecology

## 2012-03-16 LAB — CBC
Platelets: 172 10*3/uL (ref 150–400)
RDW: 13.2 % (ref 11.5–15.5)
WBC: 15.7 10*3/uL — ABNORMAL HIGH (ref 4.0–10.5)

## 2012-03-16 NOTE — Progress Notes (Signed)
I received a referral from pt's nurse as well as Caro Hight.  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  Her mother has been supportive and is currently taking care of her other 2 children, (ages 56 and 1).  She is aware of resources in the community for support including the option of coming back here to speak with one of [place] in Spiritual Care.  I offered pastoral presence and grief education including some education about how her children might react.    Please page as needed, [phone]  [facility] 10:33 AM

## 2012-03-16 NOTE — Progress Notes (Signed)
UR Chart review completed.  

## 2012-03-16 NOTE — Addendum Note (Signed)
Addendum  created 03/16/12 1019 by Suella Grove, CRNA   Modules edited:Notes Section

## 2012-03-16 NOTE — Progress Notes (Signed)
Subjective: Postpartum Day #1: Cesarean Delivery Patient reports incisional pain and tolerating PO.    Objective: Vital signs in last 24 hours: Temp:  [97.4 F (36.3 C)-98.9 F (37.2 C)] 98.5 F (36.9 C) (07/25 0539) Pulse Rate:  [55-93] 80  (07/25 0539) Resp:  [13-25] 22  (07/25 0611) BP: (99-108)/(41-73) 104/60 mmHg (07/25 0539) SpO2:  [96 %-100 %] 97 % (07/25 1610)  Physical Exam:  General: alert Lochia: appropriate Uterine Fundus: firm Incision: dressing C/D/I   Basename 03/15/12 2315 03/15/12 0910  HGB 8.7* 9.9*  HCT 25.4* 28.8*    Assessment/Plan: Status post Cesarean section. Doing well postoperatively.  Continue current care, check CBC, ambulate, saline lock IV and d/c PCA.  Francisca Langenderfer D 03/16/2012, 8:56 AM

## 2012-03-16 NOTE — Progress Notes (Deleted)
UR Chart review completed.  

## 2012-03-16 NOTE — Anesthesia Postprocedure Evaluation (Signed)
  Anesthesia Post-op Note  Patient: Christina Moody  Procedure(s) Performed: Procedure(s) (LRB): CESAREAN SECTION (N/A)  Patient Location: Women's Unit  Anesthesia Type: Spinal  Level of Consciousness: awake  Airway and Oxygen Therapy: Patient Spontanous Breathing  Post-op Pain: none  Post-op Assessment: Patient's Cardiovascular Status Stable, Respiratory Function Stable, Patent Airway, No signs of Nausea or vomiting, Adequate PO intake, Pain level controlled, No headache and No backache  Post-op Vital Signs: Reviewed and stable  Complications: No apparent anesthesia complications

## 2012-03-17 ENCOUNTER — Encounter (HOSPITAL_COMMUNITY): Payer: Self-pay

## 2012-03-17 MED ORDER — IBUPROFEN 600 MG PO TABS: 600.0000 mg | ORAL_TABLET | Freq: Four times a day (QID) | ORAL | Status: AC

## 2012-03-17 MED ORDER — OXYCODONE-ACETAMINOPHEN 5-325 MG PO TABS: 1.0000 | ORAL_TABLET | ORAL | Status: AC | PRN

## 2012-03-17 NOTE — Discharge Summary (Signed)
Obstetric Discharge Summary Reason for Admission: Fetal demise at 31 weeks, previous cesarean section x 2 Prenatal Procedures: weekly 17-P Intrapartum Procedures: cesarean: low cervical, transverse Postpartum Procedures: none Complications-Operative and Postpartum: none HGB  Date Value Range Status  01/08/2010 8.7* 11.6 - 15.9 g/dL Final     Hemoglobin  Date Value Range Status  03/16/2012 8.9* 12.0 - 15.0 g/dL Final     HCT  Date Value Range Status  03/16/2012 26.3* 36.0 - 46.0 % Final  01/08/2010 27.5* 34.8 - 46.6 % Final    Physical Exam:  General: alert Lochia: appropriate Uterine Fundus: firm Incision: healing well  Discharge Diagnoses: Fetal demise at 31 weeks- unknown cause, previous cesarean section x 2, declines vaginal delivery, TORCH IgM pending  Discharge Information: Date: 03/17/2012 Activity: pelvic rest and no strenuous activity Diet: routine Medications: Ibuprofen and Percocet Condition: stable Instructions: refer to practice specific booklet Discharge to: home Follow-up Information    Follow up with Caoilainn Sacks D, MD. Schedule an appointment as soon as possible for a visit in 2 weeks.   Contact information:   [address], [address] [place] [address] [phone]          Newborn Data: Christina Moody female, declined autopsy   Christina Moody D 03/17/2012, 7:52 AM

## 2012-03-17 NOTE — Progress Notes (Signed)
Pt. Is discharged in thecare of Mother. Downstairs per ambulatory. Stable. Denies any pain or discomfort. Abdominal operative sites is clean and dry. Steri strips are clean and intact.Pt verbalize fear and anxieties about lost.Emotional support given.  Pt understood all instructions about discharge. Question asked and answered.

## 2012-03-17 NOTE — Progress Notes (Signed)
POD #2 repeat c/s for fetal demise at 31 weeks Doing ok Afeb, VSS Abd- soft, fundus firm, incision intact Hgb stable yesterday D/c home.  Lab only able to do chromosomes on amniotic fluid, will check TORCH IgM on mom prior to discharge.

## 2012-03-23 ENCOUNTER — Encounter (HOSPITAL_COMMUNITY): Payer: Self-pay | Admitting: *Deleted

## 2012-03-23 LAB — TORCH-IGM(TOXO/ RUB/ CMV/ HSV) W TITER
RPR Screen: NONREACTIVE
Rubella IgM Index: <0.9 Ratio (ref <0.90)
Toxoplasma IgM: NEGATIVE

## 2012-04-18 ENCOUNTER — Encounter (HOSPITAL_COMMUNITY): Payer: Self-pay

## 2012-04-18 ENCOUNTER — Inpatient Hospital Stay (HOSPITAL_COMMUNITY): Admit: 2012-04-18 | Payer: Medicaid Other | Admitting: Obstetrics and Gynecology

## 2012-04-18 SURGERY — Surgical Case
Anesthesia: Choice

## 2013-01-08 ENCOUNTER — Encounter (HOSPITAL_COMMUNITY): Payer: Self-pay | Admitting: Emergency Medicine

## 2013-01-08 ENCOUNTER — Emergency Department (HOSPITAL_COMMUNITY): Payer: Self-pay

## 2013-01-08 ENCOUNTER — Emergency Department (HOSPITAL_COMMUNITY)
Admission: EM | Admit: 2013-01-08 | Discharge: 2013-01-08 | Disposition: A | Discharge status: Home / Self Care | Payer: Self-pay | Attending: Emergency Medicine | Admitting: Emergency Medicine

## 2013-01-08 DIAGNOSIS — Y9389 Activity, other specified: Secondary | ICD-10-CM | POA: Insufficient documentation

## 2013-01-08 DIAGNOSIS — Y92009 Unspecified place in unspecified non-institutional (private) residence as the place of occurrence of the external cause: Secondary | ICD-10-CM | POA: Insufficient documentation

## 2013-01-08 DIAGNOSIS — Z87828 Personal history of other (healed) physical injury and trauma: Secondary | ICD-10-CM | POA: Insufficient documentation

## 2013-01-08 DIAGNOSIS — J45909 Unspecified asthma, uncomplicated: Secondary | ICD-10-CM | POA: Insufficient documentation

## 2013-01-08 DIAGNOSIS — Z79899 Other long term (current) drug therapy: Secondary | ICD-10-CM | POA: Insufficient documentation

## 2013-01-08 DIAGNOSIS — S93409A Sprain of unspecified ligament of unspecified ankle, initial encounter: Secondary | ICD-10-CM | POA: Insufficient documentation

## 2013-01-08 DIAGNOSIS — Z85850 Personal history of malignant neoplasm of thyroid: Secondary | ICD-10-CM | POA: Insufficient documentation

## 2013-01-08 DIAGNOSIS — Z87891 Personal history of nicotine dependence: Secondary | ICD-10-CM | POA: Insufficient documentation

## 2013-01-08 DIAGNOSIS — E039 Hypothyroidism, unspecified: Secondary | ICD-10-CM | POA: Insufficient documentation

## 2013-01-08 DIAGNOSIS — X500XXA Overexertion from strenuous movement or load, initial encounter: Secondary | ICD-10-CM | POA: Insufficient documentation

## 2013-01-08 MED ORDER — LEVOTHYROXINE SODIUM 100 MCG PO TABS: 100.0000 ug | ORAL_TABLET | Freq: Every day | ORAL | Status: DC

## 2013-01-08 MED ORDER — TRAMADOL HCL 50 MG PO TABS: 50.0000 mg | ORAL_TABLET | Freq: Three times a day (TID) | ORAL | Status: DC | PRN

## 2013-01-08 MED ORDER — TRAMADOL HCL 50 MG PO TABS
50.0000 mg | ORAL_TABLET | Freq: Once | ORAL | Status: AC
  Administered 2013-01-08: 50 mg via ORAL
  Filled 2013-01-08: qty 1

## 2013-01-08 NOTE — ED Provider Notes (Signed)
I saw and evaluated the patient, reviewed the resident's note and I agree with the findings and plan.   .Face to face Exam:  General:  Awake HEENT:  Atraumatic Resp:  Normal effort Abd:  Nondistended Neuro:No focal weakness Lymph: No adenopathy    Nelia Shi, MD 01/08/13 [phone]

## 2013-01-08 NOTE — Progress Notes (Signed)
Noted EDP requesting pt to have f/u care CM spoke with pt who confirms self pay [facility] resident with no pcp. Previously worked at [employer] and previously had medicaid coverage per pt.  CM discussed and provided written information for self pay pcps, importance of pcp for f/u care, www.needymeds.org, discounted pharmacies and other [place] resources such as financial assistance, DSS and  health department  Reviewed resources for [employer] self pay pcps like [facility], family medicine at [address], [facility] family practice, general medical clinics, [facility] urgent care plus others, [facility] out patient pharmacies and housing Pt voiced understanding and appreciation of resources provided

## 2013-01-08 NOTE — ED Notes (Signed)
MD at bedside. 

## 2013-01-08 NOTE — ED Notes (Signed)
Per pt, stepped off porch yesterday and twisted left ankle

## 2013-01-08 NOTE — ED Notes (Signed)
Patient transported to X-ray 
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
## 2013-03-24 ENCOUNTER — Encounter (HOSPITAL_COMMUNITY): Payer: Self-pay | Admitting: Emergency Medicine

## 2013-03-24 ENCOUNTER — Emergency Department (INDEPENDENT_AMBULATORY_CARE_PROVIDER_SITE_OTHER)
Admission: EM | Admit: 2013-03-24 | Discharge: 2013-03-24 | Disposition: A | Discharge status: Home / Self Care | Payer: Self-pay | Source: Home / Self Care | Attending: Family Medicine | Admitting: Family Medicine

## 2013-03-24 DIAGNOSIS — E039 Hypothyroidism, unspecified: Secondary | ICD-10-CM

## 2013-03-24 MED ORDER — LEVOTHYROXINE SODIUM 100 MCG PO TABS: 100.0000 ug | ORAL_TABLET | Freq: Every day | ORAL | Status: DC
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
## 2013-03-24 NOTE — ED Notes (Signed)
Wants refill on synthroid.  Patient has missed any doses.  No recent blood work done.

## 2013-06-05 ENCOUNTER — Emergency Department (INDEPENDENT_AMBULATORY_CARE_PROVIDER_SITE_OTHER)
Admission: EM | Admit: 2013-06-05 | Discharge: 2013-06-05 | Disposition: A | Discharge status: Home / Self Care | Payer: Self-pay | Source: Home / Self Care

## 2013-06-05 ENCOUNTER — Encounter (HOSPITAL_COMMUNITY): Payer: Self-pay | Admitting: Emergency Medicine

## 2013-06-05 DIAGNOSIS — E039 Hypothyroidism, unspecified: Secondary | ICD-10-CM

## 2013-06-05 MED ORDER — LEVOTHYROXINE SODIUM 100 MCG PO TABS: 100.0000 ug | ORAL_TABLET | Freq: Every day | ORAL | Status: DC

## 2013-06-05 NOTE — ED Notes (Signed)
Pt is needing refills on her thyroid meds She has noticed intermittent numbness of body??? When she's been off her meds for a while Voices no other concerns... Alert w/no signs of acute distress.

## 2013-06-05 NOTE — ED Provider Notes (Signed)
Medical screening examination/treatment/procedure(s) were performed by resident physician or non-physician practitioner and as supervising physician I was immediately available for consultation/collaboration.   Laticha Ferrucci DOUGLAS MD.   Magen Suriano D Miamarie Moll, MD 06/05/13 1414 
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
## 2013-08-11 IMAGING — CR DG CHEST 2V
2 series · 2 of 2 positions shown · non-contrast
Comparison: None.

CLINICAL DATA: Fever, cough.

CHEST - 2 VIEW

[w chest pa]
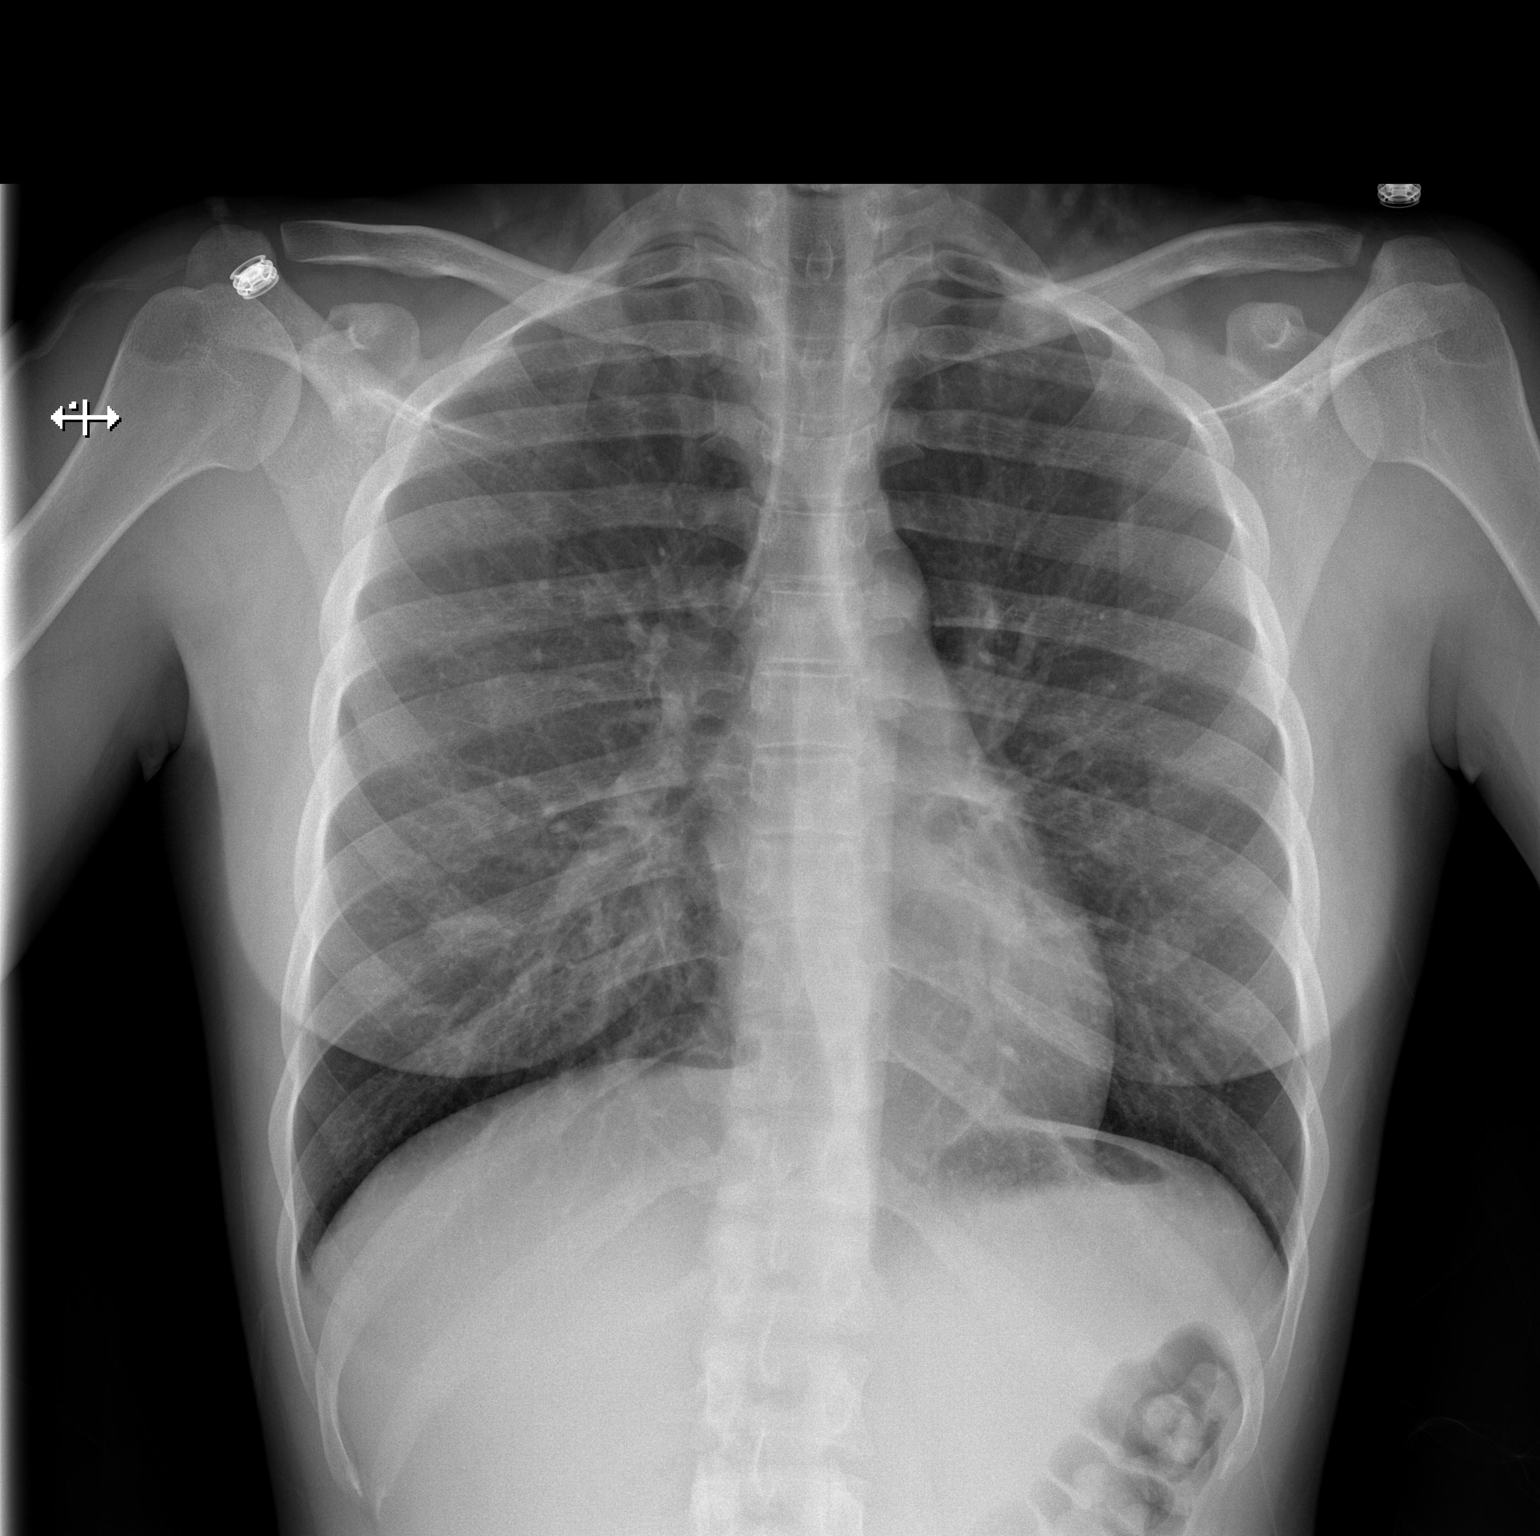

[w chest lat]
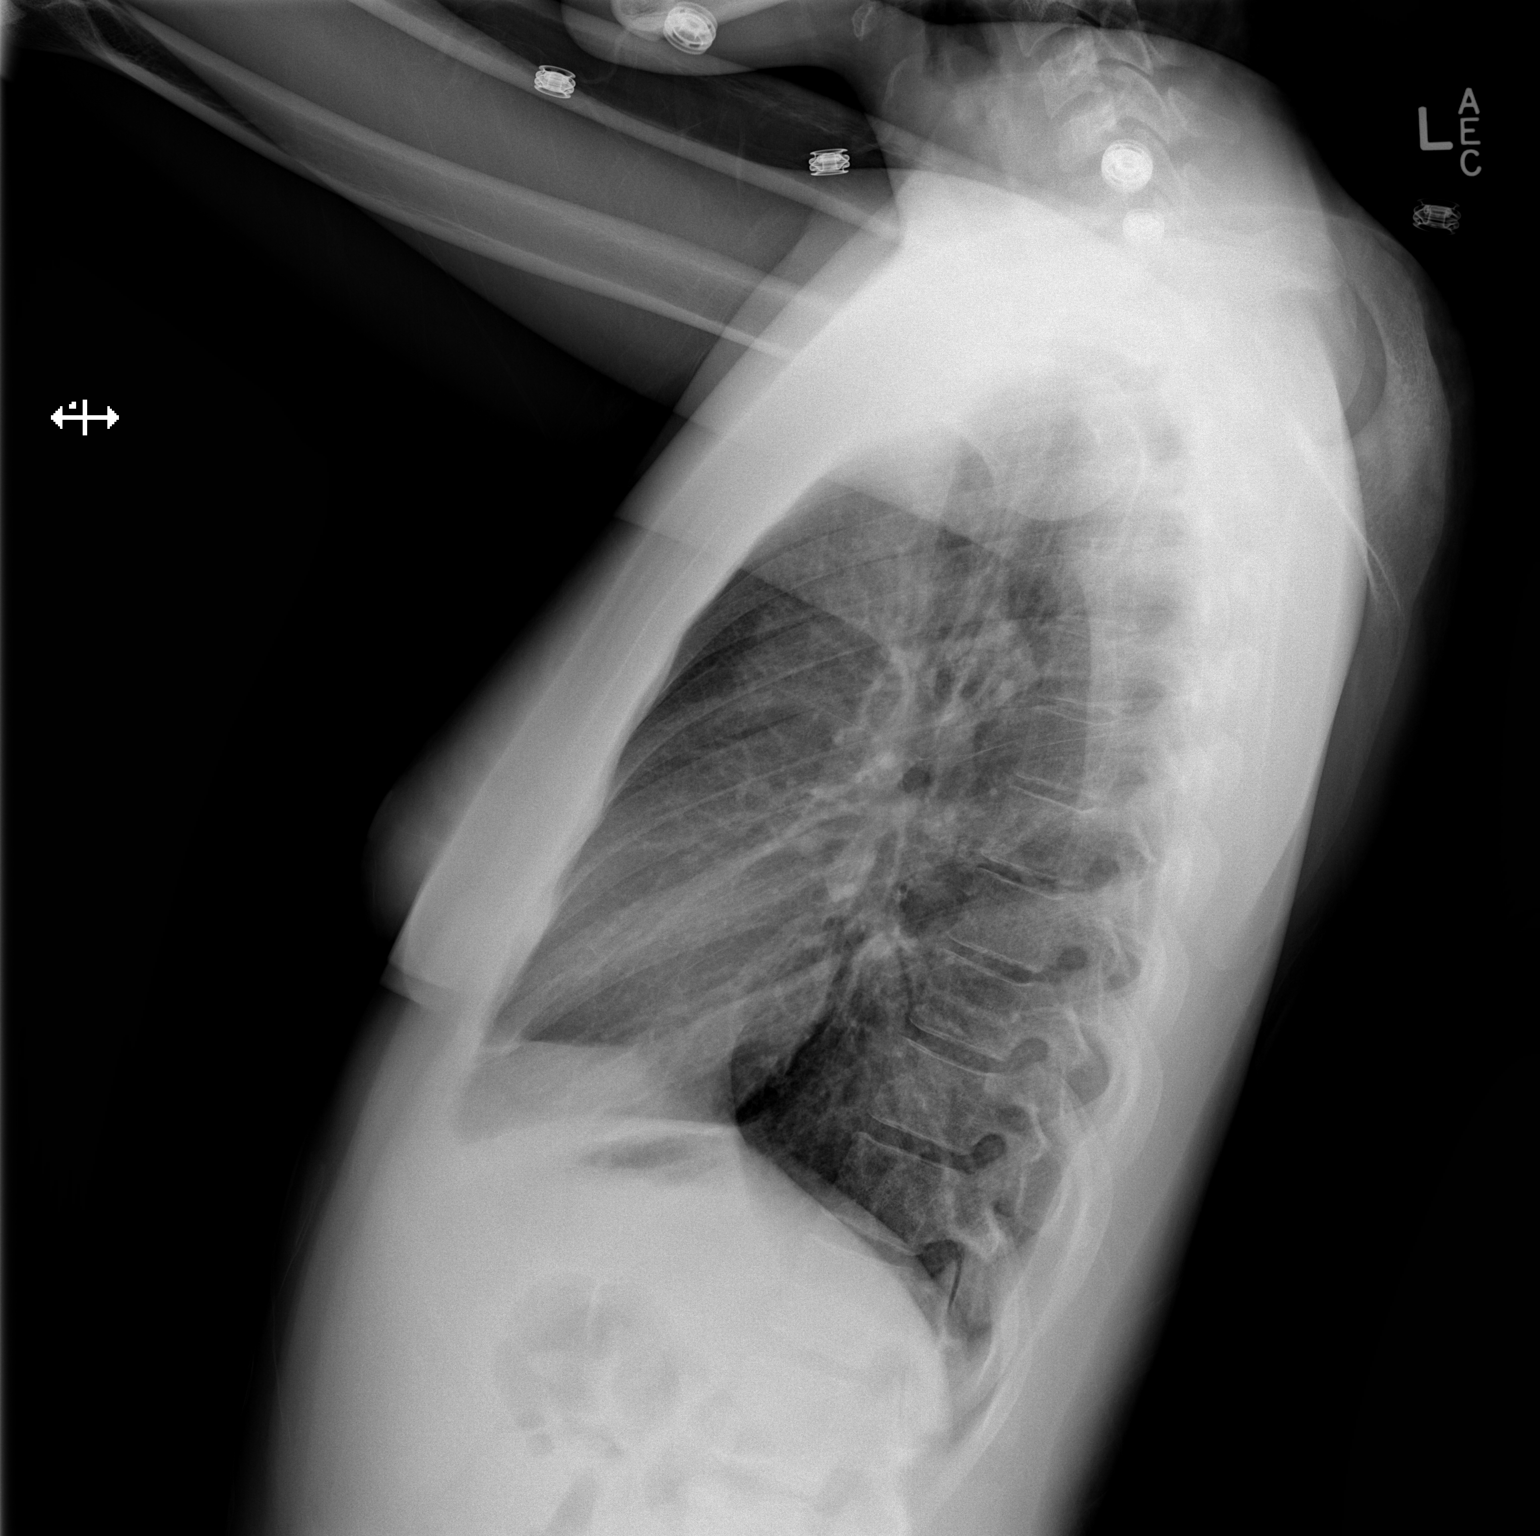

[2 of 2 positions shown; findings below may reference images not displayed]

FINDINGS: Mild peribronchial thickening. Heart and mediastinal
contours are within normal limits.  No focal opacities or
effusions.  No acute bony abnormality.
IMPRESSION: No active cardiopulmonary disease.

## 2014-06-14 ENCOUNTER — Emergency Department (HOSPITAL_COMMUNITY)
Admission: EM | Admit: 2014-06-14 | Discharge: 2014-06-14 | Disposition: A | Discharge status: Home / Self Care | Payer: Self-pay | Source: Home / Self Care | Attending: Family Medicine | Admitting: Family Medicine

## 2014-06-14 DIAGNOSIS — J069 Acute upper respiratory infection, unspecified: Secondary | ICD-10-CM

## 2014-06-14 LAB — POCT RAPID STREP A: Streptococcus, Group A Screen (Direct): NEGATIVE

## 2014-06-14 LAB — TSH: TSH: 99.77 u[IU]/mL — ABNORMAL HIGH (ref 0.350–4.500)

## 2014-06-14 MED ORDER — PROMETHAZINE HCL 25 MG PO TABS: 25.0000 mg | ORAL_TABLET | Freq: Four times a day (QID) | ORAL | Status: DC | PRN

## 2014-06-14 MED ORDER — GUAIFENESIN-CODEINE 100-10 MG/5ML PO SOLN: 5.0000 mL | ORAL | Status: DC | PRN
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
## 2014-06-14 NOTE — Discharge Instructions (Signed)
Cough, Adult ° A cough is a reflex that helps clear your throat and airways. It can help heal the body or may be a reaction to an irritated airway. A cough may only last 2 or 3 weeks (acute) or may last more than 8 weeks (chronic).  °CAUSES °Acute cough: °· Viral or bacterial infections. °Chronic cough: °· Infections. °· Allergies. °· Asthma. °· Post-nasal drip. °· Smoking. °· Heartburn or acid reflux. °· Some medicines. °· Chronic lung problems (COPD). °· Cancer. °SYMPTOMS  °· Cough. °· Fever. °· Chest pain. °· Increased breathing rate. °· High-pitched whistling sound when breathing (wheezing). °· Colored mucus that you cough up (sputum). °TREATMENT  °· A bacterial cough may be treated with antibiotic medicine. °· A viral cough must run its course and will not respond to antibiotics. °· Your caregiver may recommend other treatments if you have a chronic cough. °HOME CARE INSTRUCTIONS  °· Only take over-the-counter or prescription medicines for pain, discomfort, or fever as directed by your caregiver. Use cough suppressants only as directed by your caregiver. °· Use a cold steam vaporizer or humidifier in your bedroom or home to help loosen secretions. °· Sleep in a semi-upright position if your cough is worse at night. °· Rest as needed. °· Stop smoking if you smoke. °SEEK IMMEDIATE MEDICAL CARE IF:  °· You have pus in your sputum. °· Your cough starts to worsen. °· You cannot control your cough with suppressants and are losing sleep. °· You begin coughing up blood. °· You have difficulty breathing. °· You develop pain which is getting worse or is uncontrolled with medicine. °· You have a fever. °MAKE SURE YOU:  °· Understand these instructions. °· Will watch your condition. °· Will get help right away if you are not doing well or get worse. °Document Released: 02/05/2011 Document Revised: 11/01/2011 Document Reviewed: 02/05/2011 °ExitCare® Patient Information ©2015 [employer]. This information is not intended  to replace advice given to you by your health care provider. Make sure you discuss any questions you have with your health care provider. °Upper Respiratory Infection, Adult °An upper respiratory infection (URI) is also sometimes known as the common cold. The upper respiratory tract includes the nose, sinuses, throat, trachea, and bronchi. Bronchi are the airways leading to the lungs. Most people improve within 1 week, but symptoms can last up to 2 weeks. A residual cough may last even longer.  °CAUSES °Many different viruses can infect the tissues lining the upper respiratory tract. The tissues become irritated and inflamed and often become very moist. Mucus production is also common. A cold is contagious. You can easily spread the virus to others by oral contact. This includes kissing, sharing a glass, coughing, or sneezing. Touching your mouth or nose and then touching a surface, which is then touched by another person, can also spread the virus. °SYMPTOMS  °Symptoms typically develop 1 to 3 days after you come in contact with a cold virus. Symptoms vary from person to person. They may include: °· Runny nose. °· Sneezing. °· Nasal congestion. °· Sinus irritation. °· Sore throat. °· Loss of voice (laryngitis). °· Cough. °· Fatigue. °· Muscle aches. °· Loss of appetite. °· Headache. °· Low-grade fever. °DIAGNOSIS  °You might diagnose your own cold based on familiar symptoms, since most people get a cold 2 to 3 times a year. Your caregiver can confirm this based on your exam. Most importantly, your caregiver can check that your symptoms are not due to another disease such   as strep throat, sinusitis, pneumonia, asthma, or epiglottitis. Blood tests, throat tests, and X-rays are not necessary to diagnose a common cold, but they may sometimes be helpful in excluding other more serious diseases. Your caregiver will decide if any further tests are required. °RISKS AND COMPLICATIONS  °You may be at risk for a more severe  case of the common cold if you smoke cigarettes, have chronic heart disease (such as heart failure) or lung disease (such as asthma), or if you have a weakened immune system. The very young and very old are also at risk for more serious infections. Bacterial sinusitis, middle ear infections, and bacterial pneumonia can complicate the common cold. The common cold can worsen asthma and chronic obstructive pulmonary disease (COPD). Sometimes, these complications can require emergency medical care and may be life-threatening. °PREVENTION  °The best way to protect against getting a cold is to practice good hygiene. Avoid oral or hand contact with people with cold symptoms. Wash your hands often if contact occurs. There is no clear evidence that vitamin C, vitamin E, echinacea, or exercise reduces the chance of developing a cold. However, it is always recommended to get plenty of rest and practice good nutrition. °TREATMENT  °Treatment is directed at relieving symptoms. There is no cure. Antibiotics are not effective, because the infection is caused by a virus, not by bacteria. Treatment may include: °· Increased fluid intake. Sports drinks offer valuable electrolytes, sugars, and fluids. °· Breathing heated mist or steam (vaporizer or shower). °· Eating chicken soup or other clear broths, and maintaining good nutrition. °· Getting plenty of rest. °· Using gargles or lozenges for comfort. °· Controlling fevers with ibuprofen or acetaminophen as directed by your caregiver. °· Increasing usage of your inhaler if you have asthma. °Zinc gel and zinc lozenges, taken in the first 24 hours of the common cold, can shorten the duration and lessen the severity of symptoms. Pain medicines may help with fever, muscle aches, and throat pain. A variety of non-prescription medicines are available to treat congestion and runny nose. Your caregiver can make recommendations and may suggest nasal or lung inhalers for other symptoms.  °HOME  CARE INSTRUCTIONS  °· Only take over-the-counter or prescription medicines for pain, discomfort, or fever as directed by your caregiver. °· Use a warm mist humidifier or inhale steam from a shower to increase air moisture. This may keep secretions moist and make it easier to breathe. °· Drink enough water and fluids to keep your urine clear or pale yellow. °· Rest as needed. °· Return to work when your temperature has returned to normal or as your caregiver advises. You may need to stay home longer to avoid infecting others. You can also use a face mask and careful hand washing to prevent spread of the virus. °SEEK MEDICAL CARE IF:  °· After the first few days, you feel you are getting worse rather than better. °· You need your caregiver's advice about medicines to control symptoms. °· You develop chills, worsening shortness of breath, or brown or red sputum. These may be signs of pneumonia. °· You develop yellow or brown nasal discharge or pain in the face, especially when you bend forward. These may be signs of sinusitis. °· You develop a fever, swollen neck glands, pain with swallowing, or white areas in the back of your throat. These may be signs of strep throat. °SEEK IMMEDIATE MEDICAL CARE IF:  °· You have a fever. °· You develop severe or persistent headache, ear   pain, sinus pain, or chest pain. °· You develop wheezing, a prolonged cough, cough up blood, or have a change in your usual mucus (if you have chronic lung disease). °· You develop sore muscles or a stiff neck. °Document Released: 02/02/2001 Document Revised: 11/01/2011 Document Reviewed: 11/14/2013 °ExitCare® Patient Information ©2015 [employer]. This information is not intended to replace advice given to you by your health care provider. Make sure you discuss any questions you have with your health care provider. ° °

## 2014-06-16 LAB — CULTURE, GROUP A STREP

## 2014-06-16 NOTE — ED Provider Notes (Signed)
Medical screening examination/treatment/procedure(s) were performed by resident physician or non-physician practitioner and as supervising physician I was immediately available for consultation/collaboration.   Janziel Hockett DOUGLAS MD.   Meggan Dhaliwal D Leathie Weich, MD 06/16/14 1046 

## 2014-06-24 ENCOUNTER — Encounter (HOSPITAL_COMMUNITY): Payer: Self-pay | Admitting: Emergency Medicine

## 2015-04-22 ENCOUNTER — Other Ambulatory Visit: Payer: Self-pay | Admitting: Family Medicine

## 2015-04-22 DIAGNOSIS — E039 Hypothyroidism, unspecified: Secondary | ICD-10-CM

## 2015-04-24 ENCOUNTER — Ambulatory Visit
Admission: RE | Admit: 2015-04-24 | Discharge: 2015-04-24 | Disposition: A | Discharge status: Home / Self Care | Payer: Medicaid Other | Source: Ambulatory Visit | Attending: Family Medicine | Admitting: Family Medicine

## 2015-04-24 DIAGNOSIS — E039 Hypothyroidism, unspecified: Secondary | ICD-10-CM

## 2016-05-10 ENCOUNTER — Ambulatory Visit (HOSPITAL_COMMUNITY)
Admission: EM | Admit: 2016-05-10 | Discharge: 2016-05-10 | Disposition: A | Discharge status: Home / Self Care | Payer: Medicaid Other | Attending: Emergency Medicine | Admitting: Emergency Medicine

## 2016-05-10 ENCOUNTER — Encounter (HOSPITAL_COMMUNITY): Payer: Self-pay | Admitting: Emergency Medicine

## 2016-05-10 DIAGNOSIS — E039 Hypothyroidism, unspecified: Secondary | ICD-10-CM | POA: Diagnosis not present

## 2016-05-10 DIAGNOSIS — Z760 Encounter for issue of repeat prescription: Secondary | ICD-10-CM

## 2016-05-10 MED ORDER — LEVOTHYROXINE SODIUM 175 MCG PO TABS: 175.0000 ug | ORAL_TABLET | Freq: Every day | ORAL | 2 refills | Status: AC

## 2016-05-10 NOTE — ED Notes (Signed)
Patient has received script x 1 -hard copy script  Highlighted information needed to obtain a pcp

## 2016-05-10 NOTE — ED Notes (Signed)
Reviewed instructions and script x 1
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
## 2016-05-10 NOTE — ED Triage Notes (Signed)
Patient here for thyroid medication refill.  Patient has been out of thyroid medicine for a year

## 2016-09-23 DIAGNOSIS — S8262XA Displaced fracture of lateral malleolus of left fibula, initial encounter for closed fracture: Secondary | ICD-10-CM

## 2016-09-23 HISTORY — DX: Displaced fracture of lateral malleolus of left fibula, initial encounter for closed fracture: S82.62XA

## 2016-09-30 ENCOUNTER — Emergency Department (HOSPITAL_COMMUNITY): Payer: Medicaid Other

## 2016-09-30 ENCOUNTER — Emergency Department (HOSPITAL_COMMUNITY)
Admission: EM | Admit: 2016-09-30 | Discharge: 2016-09-30 | Disposition: A | Discharge status: Home / Self Care | Payer: Medicaid Other | Attending: Emergency Medicine | Admitting: Emergency Medicine

## 2016-09-30 ENCOUNTER — Encounter (HOSPITAL_COMMUNITY): Payer: Self-pay | Admitting: Emergency Medicine

## 2016-09-30 DIAGNOSIS — W108XXA Fall (on) (from) other stairs and steps, initial encounter: Secondary | ICD-10-CM | POA: Insufficient documentation

## 2016-09-30 DIAGNOSIS — S82432A Displaced oblique fracture of shaft of left fibula, initial encounter for closed fracture: Secondary | ICD-10-CM | POA: Diagnosis not present

## 2016-09-30 DIAGNOSIS — S99912A Unspecified injury of left ankle, initial encounter: Secondary | ICD-10-CM | POA: Diagnosis present

## 2016-09-30 DIAGNOSIS — Y999 Unspecified external cause status: Secondary | ICD-10-CM | POA: Diagnosis not present

## 2016-09-30 DIAGNOSIS — Y939 Activity, unspecified: Secondary | ICD-10-CM | POA: Diagnosis not present

## 2016-09-30 DIAGNOSIS — Y929 Unspecified place or not applicable: Secondary | ICD-10-CM | POA: Insufficient documentation

## 2016-09-30 DIAGNOSIS — J45909 Unspecified asthma, uncomplicated: Secondary | ICD-10-CM | POA: Insufficient documentation

## 2016-09-30 DIAGNOSIS — E039 Hypothyroidism, unspecified: Secondary | ICD-10-CM | POA: Diagnosis not present

## 2016-09-30 DIAGNOSIS — S82832A Other fracture of upper and lower end of left fibula, initial encounter for closed fracture: Secondary | ICD-10-CM

## 2016-09-30 MED ORDER — HYDROCODONE-ACETAMINOPHEN 5-325 MG PO TABS
1.0000 | ORAL_TABLET | Freq: Once | ORAL | Status: AC
  Administered 2016-09-30: 1 via ORAL
  Filled 2016-09-30: qty 1

## 2016-09-30 MED ORDER — NAPROXEN 500 MG PO TABS: 500.0000 mg | ORAL_TABLET | Freq: Two times a day (BID) | ORAL | 0 refills | Status: AC

## 2016-09-30 NOTE — ED Notes (Signed)
Rx x 1

## 2016-09-30 NOTE — Discharge Instructions (Signed)
Please take Ibuprofen ever 6 hours with food for pain and swelling. Continue icing the ankle for 15-20 minutes 3-5 times a day. Please schedule appointment with Dr. Roda ShuttersXu tomorrow regarding today's visit.   Get help right away if: Your cast gets damaged or breaks. You have continued severe pain. You develop new pain or swelling after the cast was put on. Your skin or toenails below the injury turn blue or gray. Your skin or toenails below the injury feel cold, numb, or have loss of sensitivity to touch. There is a bad smell or pus draining from under the cast.

## 2016-09-30 NOTE — ED Triage Notes (Signed)
Pt sts mis stepped and fell down stairs today; pt c/o left ankle pain

## 2016-09-30 NOTE — ED Notes (Signed)
Ortho tech paged and notified

## 2016-09-30 NOTE — Progress Notes (Signed)
Orthopedic Tech Progress Note Patient Details:  Christina Moody 05-Jul-1986 [id number]  Ortho Devices Type of Ortho Device: CAM walker, Crutches Ortho Device/Splint Location: lle Ortho Device/Splint Interventions: Application   Christina Moody 09/30/2016, 10:48 AM
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
## 2016-10-04 ENCOUNTER — Telehealth (INDEPENDENT_AMBULATORY_CARE_PROVIDER_SITE_OTHER): Payer: Self-pay | Admitting: Orthopaedic Surgery

## 2016-10-04 ENCOUNTER — Encounter (HOSPITAL_BASED_OUTPATIENT_CLINIC_OR_DEPARTMENT_OTHER): Payer: Self-pay | Admitting: *Deleted

## 2016-10-04 ENCOUNTER — Ambulatory Visit (INDEPENDENT_AMBULATORY_CARE_PROVIDER_SITE_OTHER): Payer: Self-pay | Admitting: Orthopaedic Surgery

## 2016-10-04 DIAGNOSIS — S8265XA Nondisplaced fracture of lateral malleolus of left fibula, initial encounter for closed fracture: Secondary | ICD-10-CM

## 2016-10-04 NOTE — Telephone Encounter (Signed)
Patient called wanting to know if there were any alternatives dealing with the surgery. CB # [phone]
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
## 2016-10-05 NOTE — Telephone Encounter (Signed)
Called patient to advise on message and she states she would like to Cx Surgery. She will come see us on Thursday. FYI

## 2016-10-05 NOTE — Telephone Encounter (Signed)
Surgery canceled

## 2016-10-05 NOTE — Telephone Encounter (Signed)
Please advise 

## 2016-10-05 NOTE — Telephone Encounter (Signed)
We can try to treat it in a cast but it may not heal correctly.  If it was my ankle I would want it fixed.

## 2016-10-05 NOTE — Telephone Encounter (Signed)
ok 

## 2016-10-06 ENCOUNTER — Ambulatory Visit (HOSPITAL_BASED_OUTPATIENT_CLINIC_OR_DEPARTMENT_OTHER): Admission: RE | Admit: 2016-10-06 | Payer: Medicaid Other | Source: Ambulatory Visit | Admitting: Orthopaedic Surgery

## 2016-10-06 HISTORY — DX: Personal history of other diseases of the respiratory system: Z87.09

## 2016-10-06 HISTORY — DX: Displaced fracture of lateral malleolus of left fibula, initial encounter for closed fracture: S82.62XA

## 2016-10-06 HISTORY — DX: Anemia, unspecified: D64.9

## 2016-10-06 HISTORY — DX: Hyperesthesia: R20.3

## 2016-10-06 HISTORY — DX: Personal history of malignant neoplasm of thyroid: Z85.850

## 2016-10-06 SURGERY — OPEN REDUCTION INTERNAL FIXATION (ORIF) ANKLE FRACTURE
Anesthesia: General | Site: Ankle | Laterality: Left

## 2016-10-07 ENCOUNTER — Ambulatory Visit (INDEPENDENT_AMBULATORY_CARE_PROVIDER_SITE_OTHER): Payer: Medicaid Other | Admitting: Orthopaedic Surgery

## 2016-10-07 DIAGNOSIS — S8265XA Nondisplaced fracture of lateral malleolus of left fibula, initial encounter for closed fracture: Secondary | ICD-10-CM

## 2016-10-07 MED ORDER — ASPIRIN EC 325 MG PO TBEC: 325.0000 mg | DELAYED_RELEASE_TABLET | Freq: Two times a day (BID) | ORAL | 0 refills | Status: AC

## 2016-10-07 NOTE — Progress Notes (Signed)
Office Visit Note   Patient: Christina KendallAutumn A Moody           Date of Birth: [birth year]           MRN: [id number] Visit Date: 10/07/2016              Requested by: Elizabeth Palaueresa Anderson, FNP [address] [place] [id number] PCP: Elizabeth PalauANDERSON,TERESA, FNP   Assessment & Plan: Visit Diagnoses:  1. Nondisplaced fracture of lateral malleolus of left fibula, initial encounter for closed fracture     Plan: discussed r/b/a associated with nonop vs. Operative treatment.  DVT and PE associated with NWB and SLC.  Aspirin 325 BID x 3 weeks prescribed.  F/u 3 weeks with repeat left ankle xrays out of cast.  She does smoke.  Follow-Up Instructions: Return in about 3 weeks (around 10/28/2016).   Orders:  No orders of the defined types were placed in this encounter.  Meds ordered this encounter  Medications  . aspirin EC 325 MG tablet    Sig: Take 1 tablet (325 mg total) by mouth 2 (two) times daily.    Dispense:  42 tablet    Refill:  0      Procedures: No procedures performed   Clinical Data: No additional findings.   Subjective: Chief Complaint  Patient presents with  . Left Ankle - Pain, Fracture    Patient comes back today requesting nonop treatment.  She doesn't want to have surgery.      Review of Systems  Constitutional: Negative.   HENT: Negative.   Eyes: Negative.   Respiratory: Negative.   Cardiovascular: Negative.   Endocrine: Negative.   Musculoskeletal: Negative.   Neurological: Negative.   Hematological: Negative.   Psychiatric/Behavioral: Negative.   All other systems reviewed and are negative.    Objective: Vital Signs: LMP 09/22/2016   Physical Exam  Constitutional: She is oriented to person, place, and time. She appears well-developed and well-nourished.  Pulmonary/Chest: Effort normal.  Neurological: She is alert and oriented to person, place, and time.  Skin: Skin is warm. Capillary refill takes less than 2 seconds.  Psychiatric:  She has a normal mood and affect. Her behavior is normal. Judgment and thought content normal.  Nursing note and vitals reviewed.   Ortho Exam Left ankle exam is stable.  Moderate swelling.  No calf ttp.  Specialty Comments:  No specialty comments available.  Imaging: No results found.   PMFS History: Patient Active Problem List   Diagnosis Date Noted  . Nondisplaced fracture of lateral malleolus of left fibula, initial encounter for closed fracture 10/04/2016  . Hypothyroid 01/08/2013  . Ankle sprain and strain 01/08/2013   Past Medical History:  Diagnosis Date  . Anemia    no current med.  . Fracture of left ankle, lateral malleolus 09/2016  . History of asthma    as a child  . History of thyroid cancer   . Hypothyroidism   . Sensitive skin     Family History  Problem Relation Age of Onset  . Cancer Other     Past Surgical History:  Procedure Laterality Date  . CESAREAN SECTION  03/15/2012   Procedure: CESAREAN SECTION;  Surgeon: Lavina Hammanodd Meisinger, MD;  Location: [facility];  Service: Gynecology;  Laterality: N/A;  . CESAREAN SECTION     x 2 (3 total)  . THYROID LOBECTOMY Left   . THYROIDECTOMY Right 06/29/2006   Social History   Occupational History  . Not on file.  Social History Main Topics  . Smoking status: Current Every Day Smoker    Packs/day: 1.00    Years: 14.00    Types: Cigarettes  . Smokeless tobacco: Never Used  . Alcohol use No  . Drug use: No  . Sexual activity: Yes

## 2016-10-07 NOTE — Addendum Note (Signed)
Addended by: Mayra ReelXU, N MICHAEL on: 10/07/2016 09:24 AM   Modules accepted: Orders

## 2016-10-14 ENCOUNTER — Inpatient Hospital Stay (INDEPENDENT_AMBULATORY_CARE_PROVIDER_SITE_OTHER): Payer: Self-pay | Admitting: Orthopaedic Surgery

## 2016-10-28 ENCOUNTER — Ambulatory Visit (INDEPENDENT_AMBULATORY_CARE_PROVIDER_SITE_OTHER): Payer: Medicaid Other | Admitting: Orthopaedic Surgery

## 2016-10-28 ENCOUNTER — Encounter (INDEPENDENT_AMBULATORY_CARE_PROVIDER_SITE_OTHER): Payer: Self-pay | Admitting: Orthopaedic Surgery

## 2016-10-28 ENCOUNTER — Ambulatory Visit (INDEPENDENT_AMBULATORY_CARE_PROVIDER_SITE_OTHER): Payer: Medicaid Other

## 2016-10-28 DIAGNOSIS — S8265XA Nondisplaced fracture of lateral malleolus of left fibula, initial encounter for closed fracture: Secondary | ICD-10-CM | POA: Diagnosis not present

## 2016-10-28 NOTE — Progress Notes (Signed)
Christina Moody is 4 weeks status post nondisplaced fibula fracture. She is doing well. She denies any pain. She denies any calf pain. She has been taking aspirin while she is been in a cast. Physical exam is benign. X-ray show stable alignment of the fracture with evidence of healing. At this point we'll advance her to weight-bear as tolerated in a Cam Walker. We'll see her back in 6 weeks with repeat 3 view x-rays of the left ankle. Anticipate releasing her at that time.

## 2016-11-16 ENCOUNTER — Ambulatory Visit: Payer: Medicaid Other | Attending: Orthopaedic Surgery | Admitting: Physical Therapy

## 2016-11-16 ENCOUNTER — Encounter: Payer: Self-pay | Admitting: Physical Therapy

## 2016-11-16 DIAGNOSIS — M25672 Stiffness of left ankle, not elsewhere classified: Secondary | ICD-10-CM | POA: Diagnosis present

## 2016-11-16 DIAGNOSIS — X58XXXD Exposure to other specified factors, subsequent encounter: Secondary | ICD-10-CM | POA: Diagnosis not present

## 2016-11-16 DIAGNOSIS — S82892D Other fracture of left lower leg, subsequent encounter for closed fracture with routine healing: Secondary | ICD-10-CM | POA: Diagnosis not present

## 2016-11-16 DIAGNOSIS — M25572 Pain in left ankle and joints of left foot: Secondary | ICD-10-CM | POA: Diagnosis present

## 2016-11-17 ENCOUNTER — Encounter: Payer: Self-pay | Admitting: Physical Therapy

## 2016-11-17 NOTE — Therapy (Addendum)
[place], [place], [address] Phone: [phone]   Fax:  [phone]  Physical Therapy Evaluation/ Discharge   Patient Details  Name: Christina Moody MRN: [id number] Date of Birth: Jan 06, 1986 Referring Provider: Dr Frankey Shown   Encounter Date: 11/16/2016    Past Medical History:  Diagnosis Date  . Anemia    no current med.  . Fracture of left ankle, lateral malleolus 09/2016  . History of asthma    as a child  . History of thyroid cancer   . Hypothyroidism   . Sensitive skin     Past Surgical History:  Procedure Laterality Date  . CESAREAN SECTION  03/15/2012   Procedure: CESAREAN SECTION;  Surgeon: Cheri Fowler, MD;  Location: [facility];  Service: Gynecology;  Laterality: N/A;  . CESAREAN SECTION     x 2 (3 total)  . THYROID LOBECTOMY Left   . THYROIDECTOMY Right 06/29/2006    There were no vitals filed for this visit.       Subjective Assessment - 11/17/16 2029    Subjective Patient fractured her left ankle in February of 2018. She had a non-displaced fracture. X-rays show a non-displaced fracture. Per patient her last x-ray showed good healing. She is in a cam walker boot for 4 more weeks. She is having very little pain.,    Limitations Standing;Walking   How long can you sit comfortably? No limit q            [facility] PT Assessment - 11/17/16 0001      Assessment   Medical Diagnosis Left ankle fracture    Referring Provider Dr Frankey Shown    Onset Date/Surgical Date 09/24/16   Hand Dominance Right   Next MD Visit 4 weeks    Prior Therapy None      Precautions   Precautions None   Precaution Comments Patient is iin a cm walker boot      Restrictions   Weight Bearing Restrictions Yes   RLE Weight Bearing Weight bearing as tolerated   Other Position/Activity Restrictions to wear boot when walking      Balance Screen   Has the patient fallen in the past 6 months No   Has  the patient had a decrease in activity level because of a fear of falling?  No   Is the patient reluctant to leave their home because of a fear of falling?  No     Home Environment   Additional Comments 5 steps into her house      Prior Function   Level of Independence Independent   Leisure walking      Cognition   Overall Cognitive Status Within Functional Limits for tasks assessed   Attention Focused   Focused Attention Appears intact   Memory Appears intact   Awareness Appears intact   Problem Solving Appears intact     Observation/Other Assessments   Focus on Therapeutic Outcomes (FOTO)  53% limitation      Observation/Other Assessments-Edema    Edema --  mild swelling noted around the lateral maleolus      Sensation   Additional Comments No swelling noted     AROM   Right Ankle Dorsiflexion 20   Right Ankle Plantar Flexion 48   Right Ankle Inversion 20   Right Ankle Eversion 18   Left Ankle Dorsiflexion 12   Left Ankle Plantar Flexion 46   Left Ankle Inversion 16   Left Ankle Eversion 8  PROM   Overall PROM Comments right ankle WFL    Left Ankle Dorsiflexion 5  with pain    Left Ankle Plantar Flexion 38    Left Ankle Inversion 18 with pain    Left Ankle Eversion 10 with pain      Strength   Right Ankle Dorsiflexion 5/5   Right Ankle Plantar Flexion 5/5   Right Ankle Inversion 5/5   Right Ankle Eversion 5/5   Left Ankle Dorsiflexion 4/5   Left Ankle Plantar Flexion 2/5   Left Ankle Inversion 3+/5   Left Ankle Eversion 3/5     Palpation   Palpation comment mild tenderness to palpation in the lateral ankle      Ambulation/Gait   Gait Comments without boot; decreased heel strike and decreased weight bearing on the left ankle                    PHYSICAL THERAPY DISCHARGE SUMMARY  Visits from Start of Care: 1  Current functional level related to goals / functional outcomes: 1 visit per mediciad    Remaining deficits: 1 per mediciad     1 per medicaid  Plan: Patient agrees to discharge.  Patient goals were not met. Patient is being discharged due to meeting the stated rehab goals.  ?????             PT Education - 11/17/16 2036    Education provided Yes   Education Details significant education provided on symptom mangement and exercise progression   Person(s) Educated Patient   Methods Explanation;Demonstration;Tactile cues;Verbal cues   Comprehension Verbalized understanding;Returned demonstration;Verbal cues required;Tactile cues required          PT Short Term Goals - 11/17/16 2046      PT SHORT TERM GOAL #1   Title Patient will verbalize improtance of symptom management    Time 4   Period Weeks   Status Achieved     PT SHORT TERM GOAL #2   Title Patient will be independent with initial HEP    Time 4   Period Weeks   Status New                  Plan - 11/17/16 2038    Clinical Impression Statement Patient presents with limited dorsiflexion, eversion, inversion, and strength. The patient was given an extensive HEP with activity progression that brings her up to weight bearing outside the boot. Per medicaid guidlines the patient will only get 1 visit.  [employer]pay reviewed symptom management and the importance of regaining strength, stability, and range of motion.    Rehab Potential Good   Clinical Impairments Affecting Rehab Potential limited visits    PT Frequency One time visit   PT Treatment/Interventions ADLs/Self Care Home Management;Gait training;Stair training;Therapeutic activities;Therapeutic exercise;Patient/family education;Neuromuscular re-education   PT Next Visit Plan 1x visit    PT Home Exercise Plan see instructions    Consulted and Agree with Plan of Care Patient      Patient will benefit from skilled therapeutic intervention in order to improve the following deficits and impairments:  Abnormal gait, Pain, Decreased strength, Decreased endurance, Decreased activity  tolerance  Visit Diagnosis: Closed left ankle fracture, with routine healing, subsequent encounter - Plan: PT plan of care cert/re-cert  Stiffness of left ankle, not elsewhere classified - Plan: PT plan of care cert/re-cert  Pain in left ankle and joints of left foot - Plan: PT plan of care cert/re-cert     Problem List  Patient Active Problem List   Diagnosis Date Noted  . Nondisplaced fracture of lateral malleolus of left fibula, initial encounter for closed fracture 10/04/2016  . Hypothyroid 01/08/2013  . Ankle sprain and strain 01/08/2013    Carney Living PT DPT  11/17/2016, 8:54 PM  [facility] [address] [place], [place], [address] Phone: [phone]   Fax:  [phone]  Name: KYESHA BALLA MRN: [id number] Date of Birth: 10-May-1986

## 2016-12-09 ENCOUNTER — Encounter (INDEPENDENT_AMBULATORY_CARE_PROVIDER_SITE_OTHER): Payer: Self-pay | Admitting: Orthopaedic Surgery

## 2016-12-09 ENCOUNTER — Ambulatory Visit (INDEPENDENT_AMBULATORY_CARE_PROVIDER_SITE_OTHER): Payer: Medicaid Other | Admitting: Orthopaedic Surgery

## 2016-12-09 ENCOUNTER — Ambulatory Visit (INDEPENDENT_AMBULATORY_CARE_PROVIDER_SITE_OTHER): Payer: Medicaid Other

## 2016-12-09 DIAGNOSIS — S8265XA Nondisplaced fracture of lateral malleolus of left fibula, initial encounter for closed fracture: Secondary | ICD-10-CM | POA: Diagnosis not present

## 2016-12-09 NOTE — Progress Notes (Signed)
Christina Moody is 8-9 weeks status post nondisplaced distal fibula fracture. She is not complaining of any symptoms. No pain no swelling she's been wearing regular shoes for about 2 weeks now. X-ray show healed fracture without any competition. On exam she has no swelling or tenderness palpation. At this point she can increase activity as tolerated. I will see her back as needed questions encouraged and answered.

## 2018-10-18 IMAGING — CR DG ANKLE COMPLETE 3+V*L*
3 series · 3 of 3 positions shown · non-contrast
Comparison: 01/08/2013

CLINICAL DATA: Twisted ankle with lateral pain, initial encounter

EXAM:
LEFT ANKLE COMPLETE - 3+ VIEW

[ankle ap]
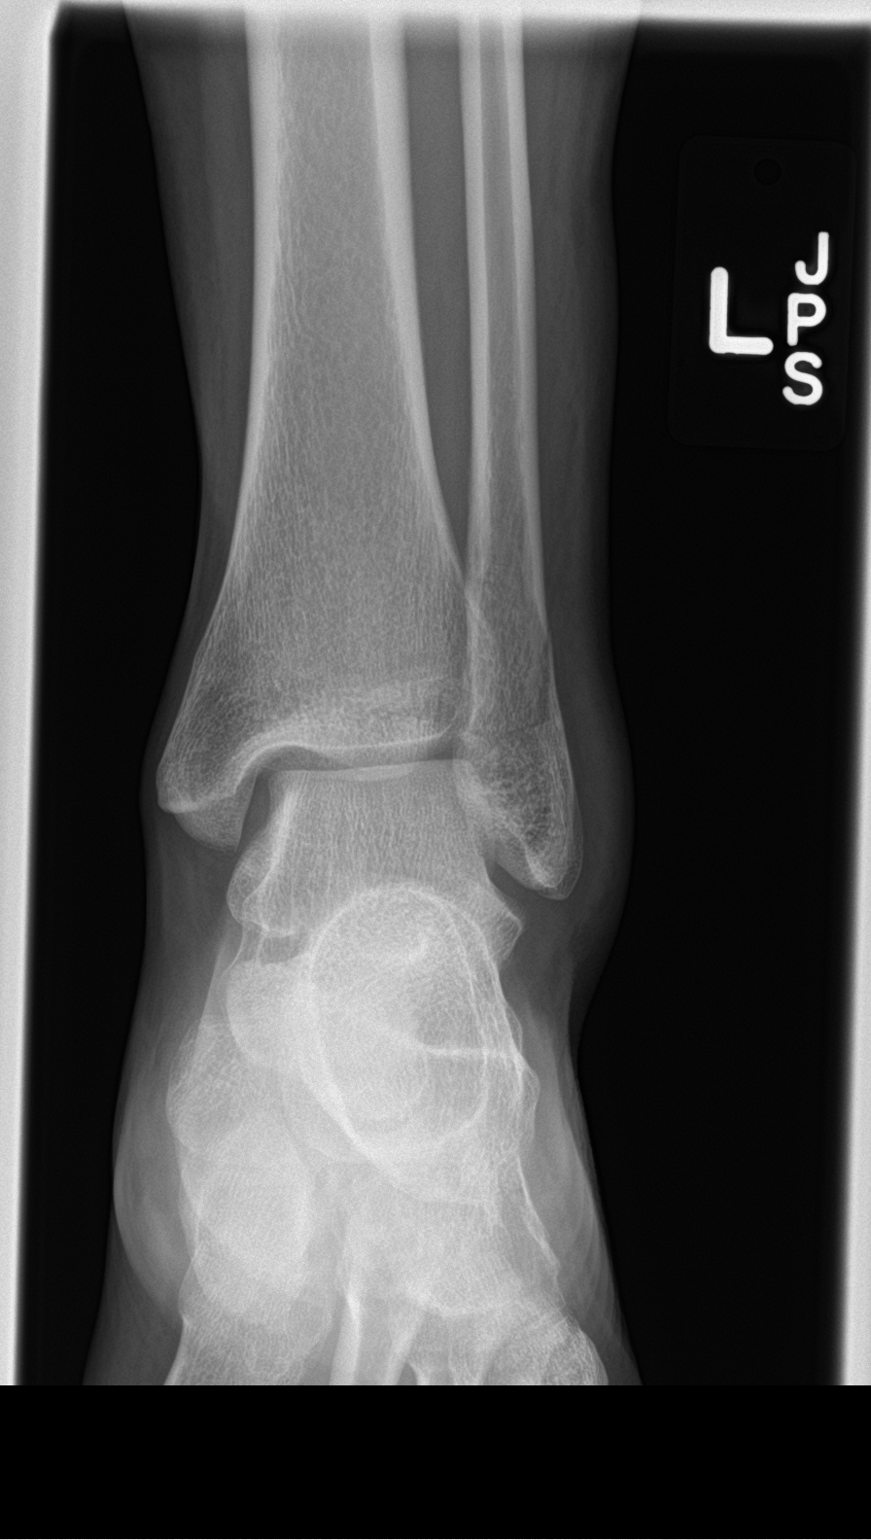

[ankle obl]
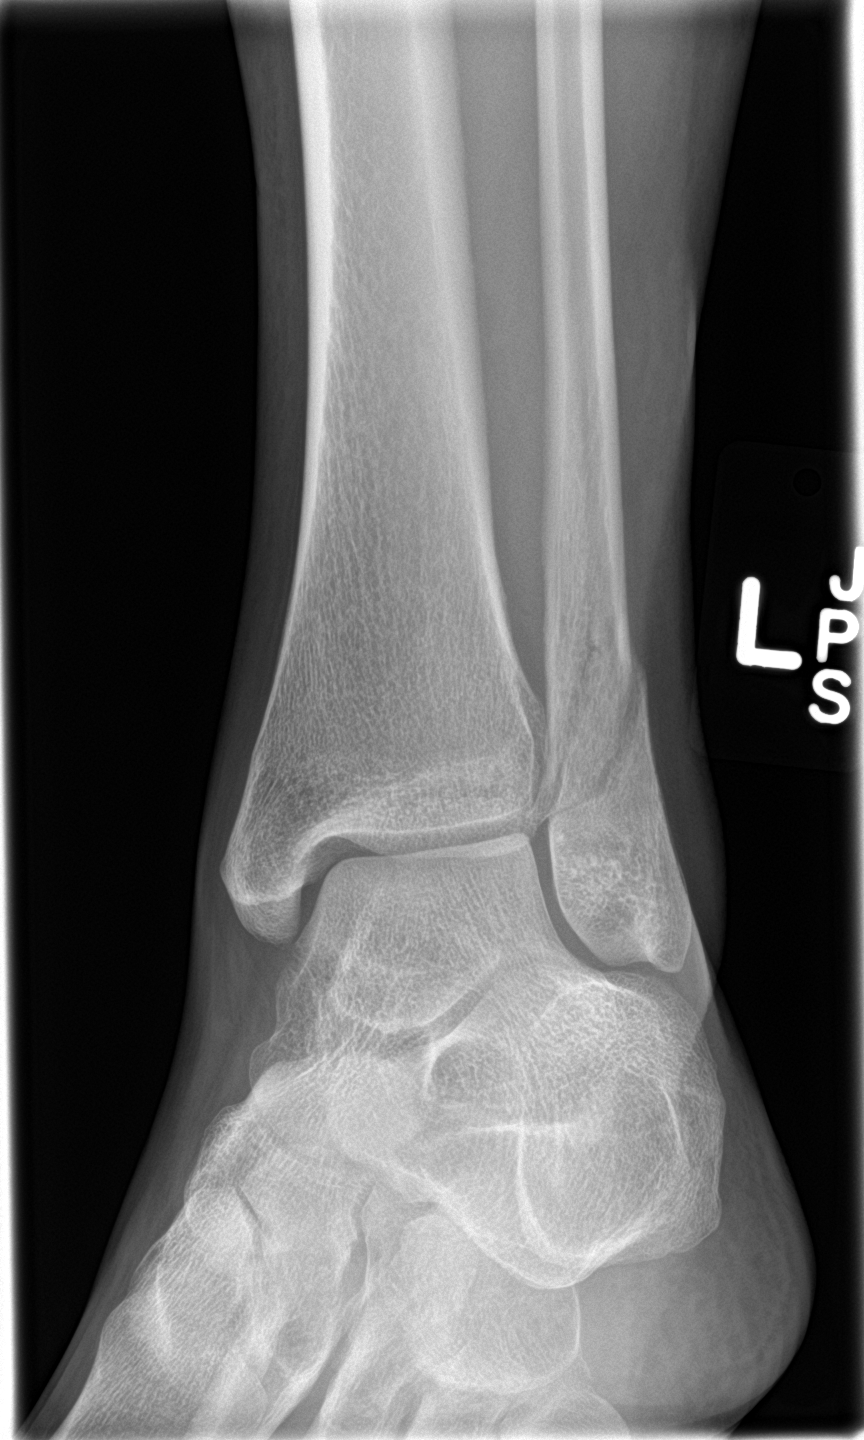

[ankle lat]
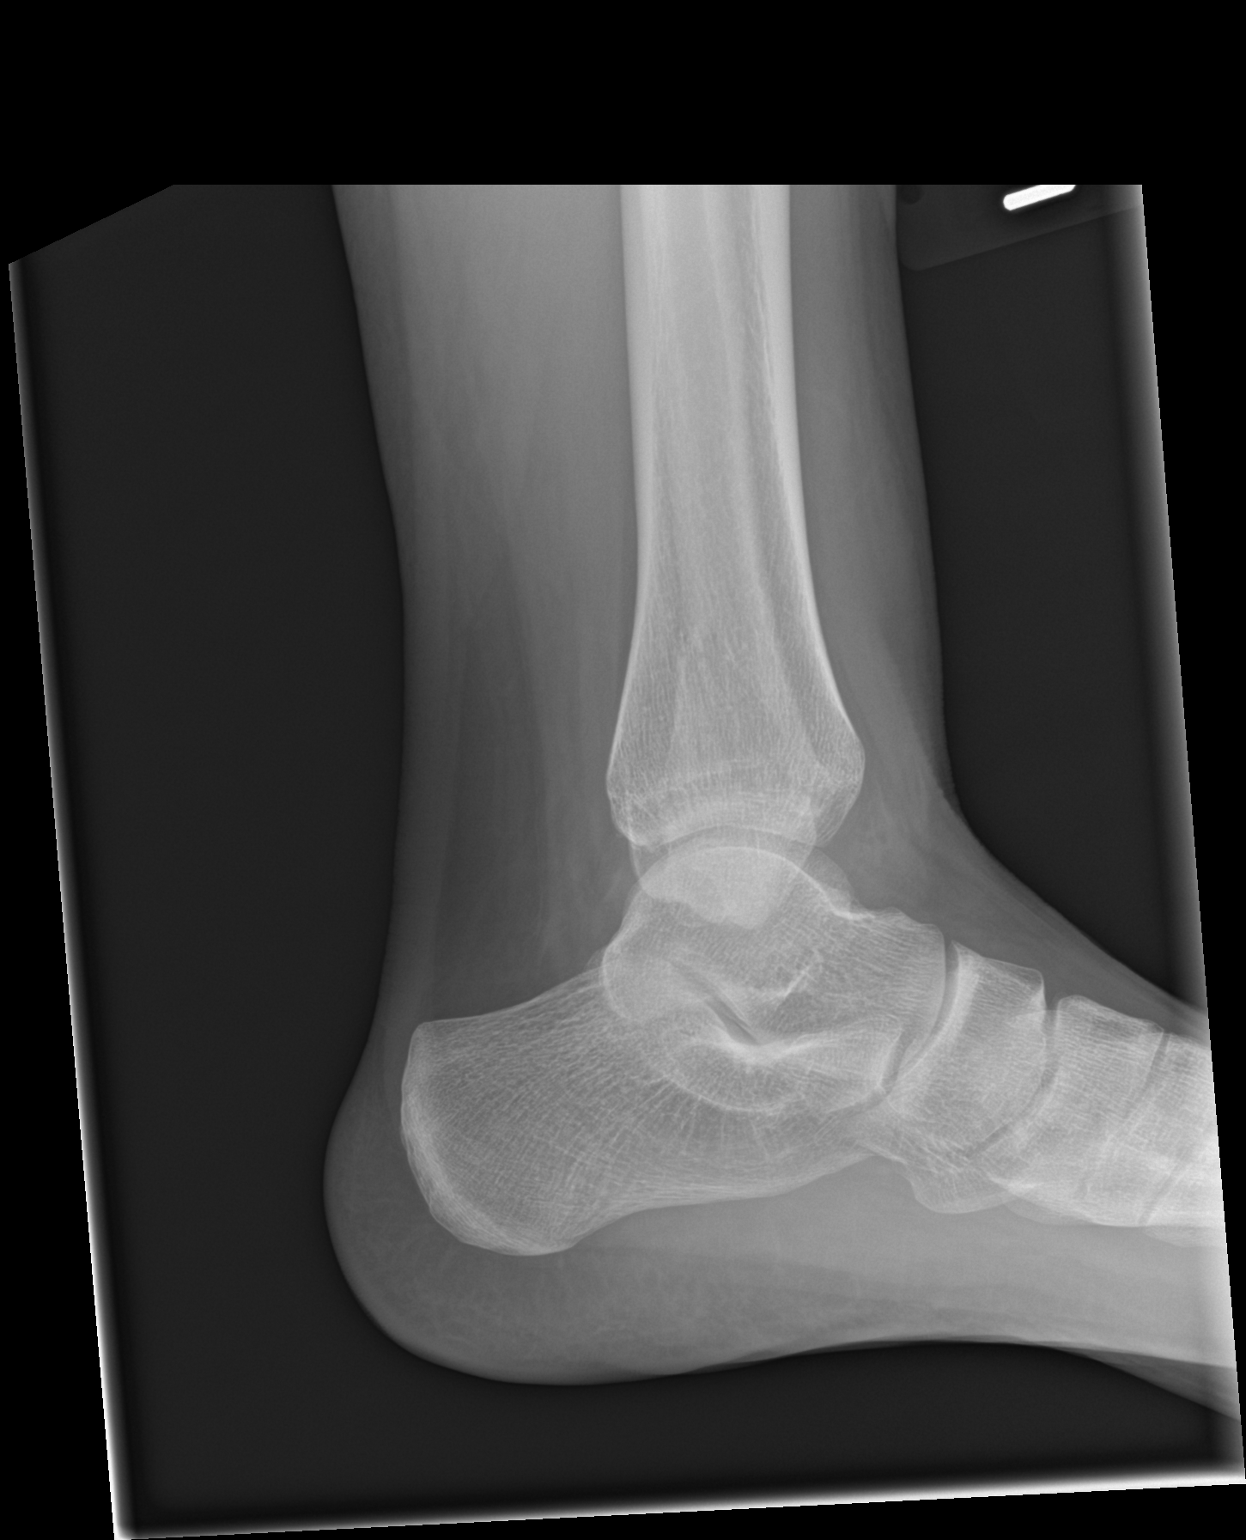

[3 of 3 positions shown; findings below may reference images not displayed]

FINDINGS: Oblique fracture through the distal fibula is noted with only mild
displacement. No other fracture is seen. Mild soft tissue swelling
is noted.
IMPRESSION: Mild soft tissue swelling and distal fibular fracture.

## 2020-07-20 ENCOUNTER — Emergency Department (HOSPITAL_COMMUNITY)
Admission: EM | Admit: 2020-07-20 | Discharge: 2020-07-20 | Disposition: A | Discharge status: Home / Self Care | Payer: Medicaid Other | Attending: Emergency Medicine | Admitting: Emergency Medicine

## 2020-07-20 ENCOUNTER — Other Ambulatory Visit: Payer: Self-pay

## 2020-07-20 ENCOUNTER — Encounter (HOSPITAL_COMMUNITY): Payer: Self-pay | Admitting: Emergency Medicine

## 2020-07-20 DIAGNOSIS — Z87890 Personal history of sex reassignment: Secondary | ICD-10-CM | POA: Insufficient documentation

## 2020-07-20 DIAGNOSIS — Z79899 Other long term (current) drug therapy: Secondary | ICD-10-CM | POA: Insufficient documentation

## 2020-07-20 DIAGNOSIS — B373 Candidiasis of vulva and vagina: Secondary | ICD-10-CM | POA: Diagnosis not present

## 2020-07-20 DIAGNOSIS — B3731 Acute candidiasis of vulva and vagina: Secondary | ICD-10-CM

## 2020-07-20 DIAGNOSIS — Z85850 Personal history of malignant neoplasm of thyroid: Secondary | ICD-10-CM | POA: Insufficient documentation

## 2020-07-20 DIAGNOSIS — N898 Other specified noninflammatory disorders of vagina: Secondary | ICD-10-CM | POA: Diagnosis not present

## 2020-07-20 DIAGNOSIS — Z202 Contact with and (suspected) exposure to infections with a predominantly sexual mode of transmission: Secondary | ICD-10-CM | POA: Diagnosis present

## 2020-07-20 DIAGNOSIS — F17210 Nicotine dependence, cigarettes, uncomplicated: Secondary | ICD-10-CM | POA: Insufficient documentation

## 2020-07-20 DIAGNOSIS — E039 Hypothyroidism, unspecified: Secondary | ICD-10-CM | POA: Diagnosis not present

## 2020-07-20 DIAGNOSIS — R0981 Nasal congestion: Secondary | ICD-10-CM | POA: Insufficient documentation

## 2020-07-20 DIAGNOSIS — J029 Acute pharyngitis, unspecified: Secondary | ICD-10-CM | POA: Diagnosis not present

## 2020-07-20 DIAGNOSIS — R102 Pelvic and perineal pain: Secondary | ICD-10-CM | POA: Diagnosis not present

## 2020-07-20 DIAGNOSIS — Z114 Encounter for screening for human immunodeficiency virus [HIV]: Secondary | ICD-10-CM | POA: Insufficient documentation

## 2020-07-20 DIAGNOSIS — Z7251 High risk heterosexual behavior: Secondary | ICD-10-CM

## 2020-07-20 LAB — WET PREP, GENITAL
Clue Cells Wet Prep HPF POC: NONE SEEN
Sperm: NONE SEEN
Trich, Wet Prep: NONE SEEN

## 2020-07-20 LAB — HIV ANTIBODY (ROUTINE TESTING W REFLEX): HIV Screen 4th Generation wRfx: NONREACTIVE

## 2020-07-20 LAB — RPR: RPR Ser Ql: NONREACTIVE

## 2020-07-20 MED ORDER — LIDOCAINE HCL 1 % IJ SOLN
INTRAMUSCULAR | Status: AC
  Filled 2020-07-20: qty 20

## 2020-07-20 MED ORDER — CEFTRIAXONE SODIUM 1 G IJ SOLR
500.0000 mg | Freq: Once | INTRAMUSCULAR | Status: AC
  Administered 2020-07-20: 06:00:00 500 mg via INTRAMUSCULAR
  Filled 2020-07-20: qty 10

## 2020-07-20 MED ORDER — DOXYCYCLINE HYCLATE 100 MG PO TABS
100.0000 mg | ORAL_TABLET | Freq: Once | ORAL | Status: AC
  Administered 2020-07-20: 06:00:00 100 mg via ORAL
  Filled 2020-07-20: qty 1

## 2020-07-20 MED ORDER — FLUCONAZOLE 150 MG PO TABS
150.0000 mg | ORAL_TABLET | Freq: Once | ORAL | Status: AC
  Administered 2020-07-20: 07:00:00 150 mg via ORAL
  Filled 2020-07-20: qty 1

## 2020-07-20 MED ORDER — DOXYCYCLINE HYCLATE 100 MG PO CAPS: 100.0000 mg | ORAL_CAPSULE | Freq: Two times a day (BID) | ORAL | 0 refills | Status: AC

## 2020-07-20 NOTE — ED Triage Notes (Signed)
Patient presents with sore throat, ear pain, and concern for STD after performing oral sex on a partner a week ago. Patient states her ears feel full and she's "under water." Patient states nasal congestion with yellow mucous, and painful swallowing.

## 2020-07-20 NOTE — Discharge Instructions (Addendum)
We have initiated treatment for gonorrhea and chlamydia today based on your symptoms and history.  Take doxycycline as prescribed until finished.  Continue 600 mg ibuprofen every 6 hours for management of pain.  Follow-up with the health department in 48 hours for the results of your STD tests. If you test positive for STDs, notify all sexual partners of their need to be tested and treated as well. Use a condom when sexually active.

## 2020-07-21 LAB — GC/CHLAMYDIA PROBE AMP (~~LOC~~) NOT AT ARMC
Chlamydia: NEGATIVE
Chlamydia: NEGATIVE
Comment: NEGATIVE
Comment: NORMAL
Comment: NEGATIVE
Comment: NORMAL
Neisseria Gonorrhea: NEGATIVE
Neisseria Gonorrhea: NEGATIVE

## 2020-07-21 NOTE — ED Provider Notes (Signed)
[facility]-EMERGENCY DEPT Provider Note   CSN: [id number] Arrival date & time: 07/20/20  0814     History Chief Complaint  Patient presents with  . Sore Throat  . Exposure to STD    Jaelee A Lafata is a 34 y.o. female.  34 year old female presents to the emergency department expressing concern for STD.  She states that she had unprotected vaginal intercourse and oral sex with a female individual 1 week ago. Began to have sore throat the following day which was followed by vaginal discharge and pelvic cramping 2 days later. Symptoms have remained persistent and unchanged.  No relief from ibuprofen.  Notes associated nasal congestion with yellow mucus, odynophagia, sensation of ear fullness.  No inability to swallow, drooling, fevers, urinary symptoms.  The history is provided by the patient. No language interpreter was used.  Sore Throat  Exposure to STD       Past Medical History:  Diagnosis Date  . Anemia    no current med.  . Fracture of left ankle, lateral malleolus 09/2016  . History of asthma    as a child  . History of thyroid cancer   . Hypothyroidism   . Sensitive skin     Patient Active Problem List   Diagnosis Date Noted  . Nondisplaced fracture of lateral malleolus of left fibula, initial encounter for closed fracture 10/04/2016  . Hypothyroid 01/08/2013  . Ankle sprain and strain 01/08/2013    Past Surgical History:  Procedure Laterality Date  . CESAREAN SECTION  03/15/2012   Procedure: CESAREAN SECTION;  Surgeon: Lavina Hamman, MD;  Location: [facility];  Service: Gynecology;  Laterality: N/A;  . CESAREAN SECTION     x 2 (3 total)  . THYROID LOBECTOMY Left   . THYROIDECTOMY Right 06/29/2006     OB History    Gravida  3   Para  3   Term  1   Preterm  2   AB      Living  2     SAB      TAB      Ectopic      Multiple      Live Births              Family History  Problem Relation Age of Onset  .  Cancer Other     Social History   Tobacco Use  . Smoking status: Current Every Day Smoker    Packs/day: 1.00    Years: 14.00    Pack years: 14.00    Types: Cigarettes  . Smokeless tobacco: Never Used  Substance Use Topics  . Alcohol use: No  . Drug use: No    Home Medications Prior to Admission medications   Medication Sig Start Date End Date Taking? Authorizing Provider  aspirin EC 325 MG tablet Take 1 tablet (325 mg total) by mouth 2 (two) times daily. 10/07/16   Tarry Kos, MD  aspirin EC 325 MG tablet Take 1 tablet (325 mg total) by mouth 2 (two) times daily. 10/07/16   Tarry Kos, MD  doxycycline (VIBRAMYCIN) 100 MG capsule Take 1 capsule (100 mg total) by mouth 2 (two) times daily for 14 days. 07/20/20 08/03/20  Antony Madura, PA-C  levothyroxine (SYNTHROID, LEVOTHROID) 175 MCG tablet Take 1 tablet (175 mcg total) by mouth daily before breakfast. 05/10/16   Elson Areas, PA-C  naproxen (NAPROSYN) 500 MG tablet Take 1 tablet (500 mg total) by mouth 2 (two) times  daily. 09/30/16   Alvina Chou, [place]  phenyltoloxamine-acetaminophen 50-500 MG tablet Take 1 tablet by mouth every 4 (four) hours as needed for pain.    [provider]    Allergies    Patient has no known allergies.  Review of Systems   Review of Systems  Ten systems reviewed and are negative for acute change, except as noted in the HPI.    Physical Exam Updated Vital Signs BP 102/88 (BP Location: Right Arm)   Pulse 97   Temp 98.8 F (37.1 C) (Oral)   Resp 16   SpO2 100%   Physical Exam Vitals and nursing note reviewed.  Constitutional:      General: She is not in acute distress.    Appearance: She is well-developed. She is not diaphoretic.     Comments: Nontoxic-appearing and in no distress  HENT:     Head: Normocephalic and atraumatic.     Right Ear: Tympanic membrane, ear canal and external ear normal.     Left Ear: Tympanic membrane, ear canal and external ear normal.      Mouth/Throat:     Comments: Scant posterior oropharyngeal erythema.  No ulcerations, exudates, lesions. Eyes:     General: No scleral icterus.    Conjunctiva/sclera: Conjunctivae normal.  Neck:     Comments: No meningismus Pulmonary:     Effort: Pulmonary effort is normal. No respiratory distress.     Comments: Respirations even and unlabored Genitourinary:    Comments: White, clumped discharge in vaginal vault. Normal external genitalia. Musculoskeletal:        General: Normal range of motion.     Cervical back: Normal range of motion.  Skin:    General: Skin is warm and dry.     Coloration: Skin is not pale.     Findings: No erythema or rash.  Neurological:     Mental Status: She is alert and oriented to person, place, and time.  Psychiatric:        Behavior: Behavior normal.     ED Results / Procedures / Treatments   Labs (all labs ordered are listed, but only abnormal results are displayed) Labs Reviewed  WET PREP, GENITAL - Abnormal; Notable for the following components:      Result Value   Yeast Wet Prep HPF POC PRESENT (*)    WBC, Wet Prep HPF POC MODERATE (*)    All other components within normal limits  RPR  HIV ANTIBODY (ROUTINE TESTING W REFLEX)  GC/CHLAMYDIA PROBE AMP ([place]) NOT AT [facility]  GC/CHLAMYDIA PROBE AMP ([place]) NOT AT [facility]    EKG None  Radiology No results found.  Procedures Procedures (including critical care time)  Medications Ordered in ED Medications  cefTRIAXone (ROCEPHIN) injection 500 mg (500 mg Intramuscular Given 07/20/20 0623)  doxycycline (VIBRA-TABS) tablet 100 mg (100 mg Oral Given 07/20/20 0606)  lidocaine (XYLOCAINE) 1 % (with pres) injection (  Given by Other 07/20/20 0723)  fluconazole (DIFLUCAN) tablet 150 mg (150 mg Oral Given 07/20/20 9509)    ED Course  I have reviewed the triage vital signs and the nursing notes.  Pertinent labs & imaging results that were available during my care of the patient were  reviewed by me and considered in my medical decision making (see chart for details).    MDM Rules/Calculators/A&P                          34 year old female  presenting with concern for STDs.  Complaining of sore throat as well as pelvic pain after unprotected vaginal intercourse and oral sex.  Her wet prep is notable for white blood cells as well as yeast.  Was given Diflucan prior to discharge.  Given shot of Rocephin in the ED.  Plan to discharge on doxycycline x 2 weeks for PID coverage.  Discussed need for safe sex practices and follow-up with a primary care doctor or OB/GYN.  Return precautions discussed and provided. Patient discharged in stable condition with no unaddressed concerns.   Final Clinical Impression(s) / ED Diagnoses Final diagnoses:  History of unprotected sex  Pharyngitis, unspecified etiology  Yeast vaginitis    Rx / DC Orders ED Discharge Orders         Ordered    doxycycline (VIBRAMYCIN) 100 MG capsule  2 times daily        07/20/20 0644           Antony Madura, PA-C 07/21/20 0537    Ward, Layla Maw, DO 07/21/20 [phone]
